# Patient Record
Sex: Female | Born: 1935 | Race: Black or African American | Hispanic: No | Smoking: Never smoker
Health system: Southern US, Community
[De-identification: ages and names within clinical notes are randomized; demographics above are authoritative.]

## PROBLEM LIST (undated history)

## (undated) DIAGNOSIS — E785 Hyperlipidemia, unspecified: Secondary | ICD-10-CM

## (undated) DIAGNOSIS — I639 Cerebral infarction, unspecified: Secondary | ICD-10-CM

## (undated) DIAGNOSIS — E079 Disorder of thyroid, unspecified: Secondary | ICD-10-CM

## (undated) DIAGNOSIS — I10 Essential (primary) hypertension: Secondary | ICD-10-CM

## (undated) HISTORY — PX: BLADDER SUSPENSION: SHX72

---

## 2004-06-16 ENCOUNTER — Inpatient Hospital Stay (HOSPITAL_COMMUNITY): Admission: RE | Admit: 2004-06-16 | Discharge: 2004-06-17 | Payer: Self-pay | Admitting: Urology

## 2004-07-07 ENCOUNTER — Ambulatory Visit (HOSPITAL_COMMUNITY): Admission: RE | Admit: 2004-07-07 | Discharge: 2004-07-07 | Payer: Self-pay | Admitting: Family Medicine

## 2004-07-07 ENCOUNTER — Inpatient Hospital Stay (HOSPITAL_COMMUNITY): Admission: EM | Admit: 2004-07-07 | Discharge: 2004-07-09 | Payer: Self-pay | Admitting: Emergency Medicine

## 2005-01-13 ENCOUNTER — Ambulatory Visit (HOSPITAL_COMMUNITY): Admission: RE | Admit: 2005-01-13 | Discharge: 2005-01-13 | Payer: Self-pay | Admitting: Family Medicine

## 2005-02-02 ENCOUNTER — Ambulatory Visit: Payer: Self-pay | Admitting: Internal Medicine

## 2005-03-10 ENCOUNTER — Ambulatory Visit: Payer: Self-pay | Admitting: Internal Medicine

## 2005-04-26 ENCOUNTER — Ambulatory Visit: Payer: Self-pay | Admitting: Internal Medicine

## 2005-06-27 ENCOUNTER — Ambulatory Visit: Payer: Self-pay | Admitting: Internal Medicine

## 2005-08-16 ENCOUNTER — Ambulatory Visit: Payer: Self-pay | Admitting: Internal Medicine

## 2005-08-16 LAB — PROTIME-INR: Protime: 22.1 Seconds — ABNORMAL HIGH (ref 10.6–13.4)

## 2005-09-06 LAB — PROTIME-INR
INR: 2.4 (ref 2.00–3.50)
Protime: 18.7 Seconds — ABNORMAL HIGH (ref 10.6–13.4)

## 2005-10-04 ENCOUNTER — Ambulatory Visit: Payer: Self-pay | Admitting: Internal Medicine

## 2005-10-04 LAB — PROTIME-INR: Protime: 25.3 Seconds (ref 10.6–13.4)

## 2005-10-18 LAB — PROTIME-INR
INR: 2.7 (ref 2.00–3.50)
Protime: 20 Seconds — ABNORMAL HIGH (ref 10.6–13.4)

## 2005-11-01 LAB — PROTIME-INR: INR: 3.2 (ref 2.00–3.50)

## 2005-12-05 ENCOUNTER — Ambulatory Visit: Payer: Self-pay | Admitting: Internal Medicine

## 2005-12-06 LAB — PROTIME-INR: Protime: 20.9 Seconds (ref 10.6–13.4)

## 2006-01-03 LAB — PROTIME-INR

## 2006-01-27 ENCOUNTER — Ambulatory Visit: Payer: Self-pay | Admitting: Internal Medicine

## 2006-02-28 LAB — PROTIME-INR: INR: 3.4 (ref 2.00–3.50)

## 2006-03-22 ENCOUNTER — Ambulatory Visit: Payer: Self-pay | Admitting: Internal Medicine

## 2006-03-24 LAB — PROTIME-INR
INR: 2.9 (ref 2.00–3.50)
Protime: 34.8 Seconds — ABNORMAL HIGH (ref 10.6–13.4)

## 2006-05-25 ENCOUNTER — Ambulatory Visit: Payer: Self-pay | Admitting: Internal Medicine

## 2006-05-31 LAB — PROTIME-INR
INR: 3.9 — ABNORMAL HIGH (ref 2.00–3.50)
Protime: 46.8 Seconds — ABNORMAL HIGH (ref 10.6–13.4)

## 2006-06-28 LAB — PROTIME-INR: INR: 2 (ref 2.00–3.50)

## 2006-07-24 ENCOUNTER — Ambulatory Visit: Payer: Self-pay | Admitting: Internal Medicine

## 2006-07-26 LAB — PROTIME-INR

## 2006-08-23 LAB — PROTIME-INR
INR: 3.1 (ref 2.00–3.50)
Protime: 37.2 Seconds — ABNORMAL HIGH (ref 10.6–13.4)

## 2006-09-11 ENCOUNTER — Ambulatory Visit: Payer: Self-pay | Admitting: Internal Medicine

## 2006-09-15 ENCOUNTER — Ambulatory Visit (HOSPITAL_COMMUNITY): Admission: RE | Admit: 2006-09-15 | Discharge: 2006-09-15 | Payer: Self-pay | Admitting: Family Medicine

## 2006-09-15 ENCOUNTER — Ambulatory Visit: Payer: Self-pay | Admitting: *Deleted

## 2006-09-15 ENCOUNTER — Encounter (INDEPENDENT_AMBULATORY_CARE_PROVIDER_SITE_OTHER): Payer: Self-pay | Admitting: *Deleted

## 2006-10-16 LAB — PROTIME-INR
INR: 1.8 — ABNORMAL LOW (ref 2.00–3.50)
Protime: 21.6 Seconds — ABNORMAL HIGH (ref 10.6–13.4)

## 2006-11-02 ENCOUNTER — Ambulatory Visit: Payer: Self-pay | Admitting: Internal Medicine

## 2006-11-08 LAB — PROTIME-INR: INR: 1.9 — ABNORMAL LOW (ref 2.00–3.50)

## 2006-11-22 LAB — PROTIME-INR: INR: 1.8 — ABNORMAL LOW (ref 2.00–3.50)

## 2006-12-18 ENCOUNTER — Ambulatory Visit: Payer: Self-pay | Admitting: Internal Medicine

## 2006-12-20 LAB — PROTIME-INR
INR: 1.9 — ABNORMAL LOW (ref 2.00–3.50)
Protime: 22.8 Seconds — ABNORMAL HIGH (ref 10.6–13.4)

## 2007-01-17 LAB — PROTIME-INR: Protime: 20.4 Seconds — ABNORMAL HIGH (ref 10.6–13.4)

## 2007-02-19 ENCOUNTER — Ambulatory Visit: Payer: Self-pay | Admitting: Internal Medicine

## 2007-02-21 LAB — PROTIME-INR
INR: 1.8 — ABNORMAL LOW (ref 2.00–3.50)
Protime: 21.6 Seconds — ABNORMAL HIGH (ref 10.6–13.4)

## 2007-03-27 LAB — PROTIME-INR
INR: 1.7 — ABNORMAL LOW (ref 2.00–3.50)
Protime: 20.4 Seconds — ABNORMAL HIGH (ref 10.6–13.4)

## 2007-03-27 LAB — CBC WITH DIFFERENTIAL/PLATELET
BASO%: 1.2 % (ref 0.0–2.0)
EOS%: 1.2 % (ref 0.0–7.0)
Eosinophils Absolute: 0.1 10*3/uL (ref 0.0–0.5)
MCH: 31.4 pg (ref 26.0–34.0)
MCHC: 34.5 g/dL (ref 32.0–36.0)
MCV: 91.1 fL (ref 81.0–101.0)
MONO%: 11.9 % (ref 0.0–13.0)
NEUT#: 2.4 10*3/uL (ref 1.5–6.5)
RBC: 3.85 10*6/uL (ref 3.70–5.32)
RDW: 12.3 % (ref 11.3–14.5)

## 2007-04-16 ENCOUNTER — Ambulatory Visit: Payer: Self-pay | Admitting: Internal Medicine

## 2007-04-18 LAB — PROTIME-INR: INR: 1.9 — ABNORMAL LOW (ref 2.00–3.50)

## 2007-04-18 LAB — CBC WITH DIFFERENTIAL/PLATELET
EOS%: 1.1 % (ref 0.0–7.0)
LYMPH%: 30.2 % (ref 14.0–48.0)
MCH: 30.8 pg (ref 26.0–34.0)
MCHC: 34.7 g/dL (ref 32.0–36.0)
MCV: 88.7 fL (ref 81.0–101.0)
MONO%: 13.5 % — ABNORMAL HIGH (ref 0.0–13.0)
RBC: 3.52 10*6/uL — ABNORMAL LOW (ref 3.70–5.32)
RDW: 11.6 % (ref 11.3–14.5)

## 2007-05-01 LAB — PROTIME-INR: INR: 1.9 — ABNORMAL LOW (ref 2.00–3.50)

## 2007-05-24 LAB — CBC WITH DIFFERENTIAL/PLATELET
BASO%: 1.3 % (ref 0.0–2.0)
EOS%: 1.8 % (ref 0.0–7.0)
MCH: 29.9 pg (ref 26.0–34.0)
MCHC: 32.8 g/dL (ref 32.0–36.0)
RBC: 3.78 10*6/uL (ref 3.70–5.32)
RDW: 11.8 % (ref 11.3–14.5)
lymph#: 1.9 10*3/uL (ref 0.9–3.3)

## 2007-05-24 LAB — PROTIME-INR: INR: 2.1 (ref 2.00–3.50)

## 2007-06-19 ENCOUNTER — Ambulatory Visit: Payer: Self-pay | Admitting: Internal Medicine

## 2007-06-21 LAB — PROTIME-INR: Protime: 26.4 Seconds — ABNORMAL HIGH (ref 10.6–13.4)

## 2007-07-26 LAB — CBC WITH DIFFERENTIAL/PLATELET
BASO%: 0.9 % (ref 0.0–2.0)
EOS%: 1.4 % (ref 0.0–7.0)
HCT: 36 % (ref 34.8–46.6)
MCH: 29.5 pg (ref 26.0–34.0)
MCHC: 32.5 g/dL (ref 32.0–36.0)
MONO#: 0.8 10*3/uL (ref 0.1–0.9)
RBC: 3.96 10*6/uL (ref 3.70–5.32)
RDW: 14 % (ref 11.3–14.5)
WBC: 5.4 10*3/uL (ref 3.9–10.0)
lymph#: 1.9 10*3/uL (ref 0.9–3.3)

## 2007-07-26 LAB — PROTIME-INR
INR: 2.7 (ref 2.00–3.50)
Protime: 32.4 Seconds — ABNORMAL HIGH (ref 10.6–13.4)

## 2007-08-21 ENCOUNTER — Ambulatory Visit: Payer: Self-pay | Admitting: Internal Medicine

## 2007-08-26 ENCOUNTER — Emergency Department (HOSPITAL_COMMUNITY): Admission: EM | Admit: 2007-08-26 | Discharge: 2007-08-26 | Payer: Self-pay | Admitting: Emergency Medicine

## 2007-08-29 LAB — PROTIME-INR: Protime: 22.8 Seconds — ABNORMAL HIGH (ref 10.6–13.4)

## 2007-11-16 ENCOUNTER — Encounter: Admission: RE | Admit: 2007-11-16 | Discharge: 2007-11-16 | Payer: Self-pay | Admitting: Family Medicine

## 2007-11-20 ENCOUNTER — Ambulatory Visit: Payer: Self-pay | Admitting: Internal Medicine

## 2007-11-22 LAB — CBC WITH DIFFERENTIAL/PLATELET
BASO%: 0.6 % (ref 0.0–2.0)
EOS%: 1.7 % (ref 0.0–7.0)
MCH: 29 pg (ref 26.0–34.0)
MCHC: 32.9 g/dL (ref 32.0–36.0)
RDW: 13.6 % (ref 11.3–14.5)
lymph#: 2.4 10*3/uL (ref 0.9–3.3)

## 2007-11-22 LAB — PROTIME-INR: INR: 2.7 (ref 2.00–3.50)

## 2007-12-20 LAB — PROTIME-INR
INR: 2.5 (ref 2.00–3.50)
Protime: 30 Seconds — ABNORMAL HIGH (ref 10.6–13.4)

## 2008-01-11 ENCOUNTER — Ambulatory Visit: Payer: Self-pay | Admitting: Internal Medicine

## 2008-01-23 LAB — PROTIME-INR: INR: 1.8 — ABNORMAL LOW (ref 2.00–3.50)

## 2008-02-21 LAB — CBC WITH DIFFERENTIAL/PLATELET
Basophils Absolute: 0.1 10*3/uL (ref 0.0–0.1)
EOS%: 2.5 % (ref 0.0–7.0)
HCT: 35.2 % (ref 34.8–46.6)
HGB: 11.4 g/dL — ABNORMAL LOW (ref 11.6–15.9)
LYMPH%: 38.9 % (ref 14.0–48.0)
MCH: 28.3 pg (ref 26.0–34.0)
MCV: 87.6 fL (ref 81.0–101.0)
NEUT%: 46.2 % (ref 39.6–76.8)
Platelets: 182 10*3/uL (ref 145–400)
lymph#: 2.1 10*3/uL (ref 0.9–3.3)

## 2008-02-21 LAB — PROTIME-INR

## 2008-03-18 ENCOUNTER — Ambulatory Visit: Payer: Self-pay | Admitting: Internal Medicine

## 2008-03-20 LAB — PROTIME-INR
INR: 2.8 (ref 2.00–3.50)
Protime: 33.6 Seconds — ABNORMAL HIGH (ref 10.6–13.4)

## 2008-04-17 LAB — PROTIME-INR
INR: 3.4 (ref 2.00–3.50)
Protime: 40.8 Seconds — ABNORMAL HIGH (ref 10.6–13.4)

## 2008-05-13 ENCOUNTER — Ambulatory Visit: Payer: Self-pay | Admitting: Internal Medicine

## 2008-05-22 LAB — CBC WITH DIFFERENTIAL/PLATELET
BASO%: 0.7 % (ref 0.0–2.0)
EOS%: 3 % (ref 0.0–7.0)
LYMPH%: 34.2 % (ref 14.0–48.0)
MCH: 28.6 pg (ref 26.0–34.0)
MCHC: 32.9 g/dL (ref 32.0–36.0)
MONO#: 0.6 10*3/uL (ref 0.1–0.9)
Platelets: 148 10*3/uL (ref 145–400)
RBC: 3.62 10*6/uL — ABNORMAL LOW (ref 3.70–5.32)
WBC: 5.1 10*3/uL (ref 3.9–10.0)
lymph#: 1.7 10*3/uL (ref 0.9–3.3)

## 2008-05-22 LAB — PROTIME-INR: Protime: 42 Seconds — ABNORMAL HIGH (ref 10.6–13.4)

## 2008-07-15 ENCOUNTER — Ambulatory Visit: Payer: Self-pay | Admitting: Internal Medicine

## 2008-07-17 LAB — PROTIME-INR: Protime: 27.6 Seconds — ABNORMAL HIGH (ref 10.6–13.4)

## 2008-08-20 LAB — CBC WITH DIFFERENTIAL/PLATELET
Basophils Absolute: 0 10*3/uL (ref 0.0–0.1)
HCT: 33.1 % — ABNORMAL LOW (ref 34.8–46.6)
HGB: 11 g/dL — ABNORMAL LOW (ref 11.6–15.9)
MONO#: 0.8 10*3/uL (ref 0.1–0.9)
NEUT#: 2 10*3/uL (ref 1.5–6.5)
NEUT%: 37.5 % — ABNORMAL LOW (ref 38.4–76.8)
RDW: 15 % — ABNORMAL HIGH (ref 11.2–14.5)
WBC: 5.3 10*3/uL (ref 3.9–10.3)
lymph#: 2.4 10*3/uL (ref 0.9–3.3)

## 2008-08-20 LAB — PROTIME-INR: INR: 2.3 (ref 2.00–3.50)

## 2008-09-17 ENCOUNTER — Ambulatory Visit: Payer: Self-pay | Admitting: Internal Medicine

## 2008-09-19 LAB — PROTIME-INR

## 2008-10-08 ENCOUNTER — Encounter: Admission: RE | Admit: 2008-10-08 | Discharge: 2008-11-20 | Payer: Self-pay | Admitting: Family Medicine

## 2008-10-20 LAB — PROTIME-INR

## 2008-11-17 ENCOUNTER — Ambulatory Visit: Payer: Self-pay | Admitting: Internal Medicine

## 2008-11-19 LAB — CBC WITH DIFFERENTIAL/PLATELET
BASO%: 0.2 % (ref 0.0–2.0)
EOS%: 2 % (ref 0.0–7.0)
HCT: 32.2 % — ABNORMAL LOW (ref 34.8–46.6)
LYMPH%: 38.9 % (ref 14.0–49.7)
MCH: 28.3 pg (ref 25.1–34.0)
MCHC: 32.9 g/dL (ref 31.5–36.0)
NEUT%: 45.6 % (ref 38.4–76.8)
Platelets: 153 10*3/uL (ref 145–400)

## 2008-12-17 ENCOUNTER — Ambulatory Visit: Payer: Self-pay | Admitting: Internal Medicine

## 2009-01-14 LAB — PROTIME-INR: INR: 2.1 (ref 2.00–3.50)

## 2009-02-09 ENCOUNTER — Ambulatory Visit: Payer: Self-pay | Admitting: Internal Medicine

## 2009-02-11 LAB — CBC WITH DIFFERENTIAL/PLATELET
Basophils Absolute: 0 10*3/uL (ref 0.0–0.1)
Eosinophils Absolute: 0.1 10*3/uL (ref 0.0–0.5)
HCT: 32.5 % — ABNORMAL LOW (ref 34.8–46.6)
HGB: 10.7 g/dL — ABNORMAL LOW (ref 11.6–15.9)
LYMPH%: 38.7 % (ref 14.0–49.7)
MCV: 87.4 fL (ref 79.5–101.0)
MONO%: 14.9 % — ABNORMAL HIGH (ref 0.0–14.0)
NEUT#: 2.2 10*3/uL (ref 1.5–6.5)
Platelets: 157 10*3/uL (ref 145–400)

## 2009-03-11 ENCOUNTER — Ambulatory Visit: Payer: Self-pay | Admitting: Internal Medicine

## 2009-04-08 LAB — PROTIME-INR
INR: 2.3 (ref 2.00–3.50)
Protime: 27.6 Seconds — ABNORMAL HIGH (ref 10.6–13.4)

## 2009-05-04 ENCOUNTER — Ambulatory Visit: Payer: Self-pay | Admitting: Internal Medicine

## 2009-05-06 LAB — CBC WITH DIFFERENTIAL/PLATELET
EOS%: 2.4 % (ref 0.0–7.0)
Eosinophils Absolute: 0.1 10*3/uL (ref 0.0–0.5)
MCH: 27.8 pg (ref 25.1–34.0)
MCV: 87 fL (ref 79.5–101.0)
MONO%: 13.3 % (ref 0.0–14.0)
NEUT#: 2 10*3/uL (ref 1.5–6.5)
RBC: 3.7 10*6/uL (ref 3.70–5.45)
RDW: 15.3 % — ABNORMAL HIGH (ref 11.2–14.5)
nRBC: 0 % (ref 0–0)

## 2009-05-06 LAB — PROTIME-INR: Protime: 32.4 Seconds — ABNORMAL HIGH (ref 10.6–13.4)

## 2009-06-03 ENCOUNTER — Ambulatory Visit: Payer: Self-pay | Admitting: Internal Medicine

## 2009-06-03 LAB — CBC WITH DIFFERENTIAL/PLATELET
BASO%: 0.2 % (ref 0.0–2.0)
Eosinophils Absolute: 0.1 10*3/uL (ref 0.0–0.5)
HCT: 33.7 % — ABNORMAL LOW (ref 34.8–46.6)
LYMPH%: 45.2 % (ref 14.0–49.7)
MCHC: 32.3 g/dL (ref 31.5–36.0)
MONO#: 0.7 10*3/uL (ref 0.1–0.9)
NEUT%: 39.8 % (ref 38.4–76.8)
Platelets: 157 10*3/uL (ref 145–400)
WBC: 5.3 10*3/uL (ref 3.9–10.3)

## 2009-07-01 LAB — CBC WITH DIFFERENTIAL/PLATELET
BASO%: 0.2 % (ref 0.0–2.0)
Basophils Absolute: 0 10*3/uL (ref 0.0–0.1)
EOS%: 1.9 % (ref 0.0–7.0)
HCT: 33.8 % — ABNORMAL LOW (ref 34.8–46.6)
HGB: 10.9 g/dL — ABNORMAL LOW (ref 11.6–15.9)
MCH: 28.1 pg (ref 25.1–34.0)
MONO#: 0.8 10*3/uL (ref 0.1–0.9)
NEUT%: 42.4 % (ref 38.4–76.8)
RDW: 15.3 % — ABNORMAL HIGH (ref 11.2–14.5)
WBC: 5.3 10*3/uL (ref 3.9–10.3)
lymph#: 2.2 10*3/uL (ref 0.9–3.3)

## 2009-07-01 LAB — PROTIME-INR
INR: 2.5 (ref 2.00–3.50)
Protime: 30 Seconds — ABNORMAL HIGH (ref 10.6–13.4)

## 2009-07-30 ENCOUNTER — Ambulatory Visit: Payer: Self-pay | Admitting: Internal Medicine

## 2009-08-03 LAB — PROTIME-INR
INR: 2.1 (ref 2.00–3.50)
Protime: 25.2 Seconds — ABNORMAL HIGH (ref 10.6–13.4)

## 2009-08-03 LAB — CBC WITH DIFFERENTIAL/PLATELET
BASO%: 0.2 % (ref 0.0–2.0)
EOS%: 2.1 % (ref 0.0–7.0)
HGB: 10.3 g/dL — ABNORMAL LOW (ref 11.6–15.9)
MCH: 28 pg (ref 25.1–34.0)
MCHC: 32 g/dL (ref 31.5–36.0)
MCV: 87.5 fL (ref 79.5–101.0)
MONO%: 12.6 % (ref 0.0–14.0)
RBC: 3.68 10*6/uL — ABNORMAL LOW (ref 3.70–5.45)
RDW: 14.8 % — ABNORMAL HIGH (ref 11.2–14.5)
lymph#: 1.8 10*3/uL (ref 0.9–3.3)

## 2009-08-04 LAB — KAPPA/LAMBDA LIGHT CHAINS
Kappa:Lambda Ratio: 0.4 (ref 0.26–1.65)
Lambda Free Lght Chn: 5.23 mg/dL — ABNORMAL HIGH (ref 0.57–2.63)

## 2009-09-14 ENCOUNTER — Ambulatory Visit: Payer: Self-pay | Admitting: Internal Medicine

## 2009-09-22 LAB — CBC WITH DIFFERENTIAL/PLATELET
BASO%: 0.2 % (ref 0.0–2.0)
HCT: 30.8 % — ABNORMAL LOW (ref 34.8–46.6)
HGB: 10.2 g/dL — ABNORMAL LOW (ref 11.6–15.9)
MCHC: 33.1 g/dL (ref 31.5–36.0)
MONO#: 0.7 10*3/uL (ref 0.1–0.9)
NEUT%: 34.9 % — ABNORMAL LOW (ref 38.4–76.8)
RDW: 14.8 % — ABNORMAL HIGH (ref 11.2–14.5)
WBC: 4.3 10*3/uL (ref 3.9–10.3)
lymph#: 2 10*3/uL (ref 0.9–3.3)

## 2009-09-22 LAB — PROTIME-INR
INR: 2.3 (ref 2.00–3.50)
Protime: 27.6 Seconds — ABNORMAL HIGH (ref 10.6–13.4)

## 2009-10-20 ENCOUNTER — Ambulatory Visit: Payer: Self-pay | Admitting: Internal Medicine

## 2009-10-22 LAB — CBC WITH DIFFERENTIAL/PLATELET
Basophils Absolute: 0 10*3/uL (ref 0.0–0.1)
EOS%: 2.1 % (ref 0.0–7.0)
HCT: 31 % — ABNORMAL LOW (ref 34.8–46.6)
HGB: 10.3 g/dL — ABNORMAL LOW (ref 11.6–15.9)
MCH: 29.4 pg (ref 25.1–34.0)
MCV: 88.2 fL (ref 79.5–101.0)
MONO%: 15.1 % — ABNORMAL HIGH (ref 0.0–14.0)
NEUT%: 45.7 % (ref 38.4–76.8)
Platelets: 165 10*3/uL (ref 145–400)

## 2009-10-22 LAB — COMPREHENSIVE METABOLIC PANEL
ALT: 18 U/L (ref 0–35)
Albumin: 3.9 g/dL (ref 3.5–5.2)
BUN: 26 mg/dL — ABNORMAL HIGH (ref 6–23)
CO2: 25 mEq/L (ref 19–32)
Calcium: 8.4 mg/dL (ref 8.4–10.5)
Chloride: 104 mEq/L (ref 96–112)
Creatinine, Ser: 1.18 mg/dL (ref 0.40–1.20)

## 2009-11-19 ENCOUNTER — Ambulatory Visit: Payer: Self-pay | Admitting: Internal Medicine

## 2009-12-17 LAB — PROTIME-INR
INR: 1.7 — ABNORMAL LOW (ref 2.00–3.50)
Protime: 20.4 Seconds — ABNORMAL HIGH (ref 10.6–13.4)

## 2010-01-18 ENCOUNTER — Ambulatory Visit: Payer: Self-pay | Admitting: Internal Medicine

## 2010-02-18 ENCOUNTER — Ambulatory Visit: Payer: Self-pay | Admitting: Internal Medicine

## 2010-02-19 LAB — PROTIME-INR
INR: 2.3 (ref 2.00–3.50)
Protime: 27.6 Seconds — ABNORMAL HIGH (ref 10.6–13.4)

## 2010-03-22 ENCOUNTER — Ambulatory Visit: Payer: Self-pay | Admitting: Internal Medicine

## 2010-03-22 LAB — PROTIME-INR
INR: 2.8 (ref 2.00–3.50)
Protime: 33.6 Seconds — ABNORMAL HIGH (ref 10.6–13.4)

## 2010-04-30 ENCOUNTER — Ambulatory Visit: Payer: Self-pay | Admitting: Internal Medicine

## 2010-05-05 LAB — CBC WITH DIFFERENTIAL/PLATELET
BASO%: 0.2 % (ref 0.0–2.0)
Basophils Absolute: 0 10*3/uL (ref 0.0–0.1)
Eosinophils Absolute: 0.1 10*3/uL (ref 0.0–0.5)
HGB: 11.2 g/dL — ABNORMAL LOW (ref 11.6–15.9)
LYMPH%: 39.5 % (ref 14.0–49.7)
MCH: 29.2 pg (ref 25.1–34.0)
MCV: 90.3 fL (ref 79.5–101.0)
MONO#: 0.5 10*3/uL (ref 0.1–0.9)
NEUT#: 1.7 10*3/uL (ref 1.5–6.5)
NEUT%: 45.2 % (ref 38.4–76.8)
RBC: 3.84 10*6/uL (ref 3.70–5.45)

## 2010-05-06 LAB — COMPREHENSIVE METABOLIC PANEL
Albumin: 4.3 g/dL (ref 3.5–5.2)
CO2: 31 mEq/L (ref 19–32)
Calcium: 9.3 mg/dL (ref 8.4–10.5)
Chloride: 101 mEq/L (ref 96–112)
Glucose, Bld: 73 mg/dL (ref 70–99)
Potassium: 3.7 mEq/L (ref 3.5–5.3)
Sodium: 140 mEq/L (ref 135–145)
Total Protein: 7.3 g/dL (ref 6.0–8.3)

## 2010-05-06 LAB — IGG, IGA, IGM: IgG (Immunoglobin G), Serum: 1830 mg/dL — ABNORMAL HIGH (ref 694–1618)

## 2010-05-06 LAB — LACTATE DEHYDROGENASE: LDH: 230 U/L (ref 94–250)

## 2010-05-30 ENCOUNTER — Encounter: Payer: Self-pay | Admitting: Internal Medicine

## 2010-06-02 ENCOUNTER — Ambulatory Visit: Payer: Self-pay | Admitting: Internal Medicine

## 2010-07-06 ENCOUNTER — Other Ambulatory Visit: Payer: Self-pay | Admitting: Internal Medicine

## 2010-07-06 ENCOUNTER — Encounter (HOSPITAL_BASED_OUTPATIENT_CLINIC_OR_DEPARTMENT_OTHER): Payer: Medicare Other | Admitting: Internal Medicine

## 2010-07-06 DIAGNOSIS — D472 Monoclonal gammopathy: Secondary | ICD-10-CM

## 2010-07-06 DIAGNOSIS — Z7901 Long term (current) use of anticoagulants: Secondary | ICD-10-CM

## 2010-07-06 DIAGNOSIS — Z86718 Personal history of other venous thrombosis and embolism: Secondary | ICD-10-CM

## 2010-07-06 LAB — PROTIME-INR
INR: 2.1 (ref 2.00–3.50)
Protime: 25.2 Seconds — ABNORMAL HIGH (ref 10.6–13.4)

## 2010-08-23 ENCOUNTER — Other Ambulatory Visit: Payer: Self-pay | Admitting: Internal Medicine

## 2010-08-23 ENCOUNTER — Encounter (HOSPITAL_BASED_OUTPATIENT_CLINIC_OR_DEPARTMENT_OTHER): Payer: Medicare Other | Admitting: Internal Medicine

## 2010-08-23 DIAGNOSIS — D472 Monoclonal gammopathy: Secondary | ICD-10-CM

## 2010-08-23 DIAGNOSIS — Z86718 Personal history of other venous thrombosis and embolism: Secondary | ICD-10-CM

## 2010-08-23 DIAGNOSIS — Z7901 Long term (current) use of anticoagulants: Secondary | ICD-10-CM

## 2010-08-23 LAB — CBC WITH DIFFERENTIAL/PLATELET
Basophils Absolute: 0 10*3/uL (ref 0.0–0.1)
EOS%: 1.6 % (ref 0.0–7.0)
Eosinophils Absolute: 0.1 10*3/uL (ref 0.0–0.5)
HCT: 32 % — ABNORMAL LOW (ref 34.8–46.6)
HGB: 10.6 g/dL — ABNORMAL LOW (ref 11.6–15.9)
MCH: 29.5 pg (ref 25.1–34.0)
MCV: 89.5 fL (ref 79.5–101.0)
NEUT#: 1.9 10*3/uL (ref 1.5–6.5)
NEUT%: 49.5 % (ref 38.4–76.8)
lymph#: 1.4 10*3/uL (ref 0.9–3.3)

## 2010-08-23 LAB — PROTIME-INR

## 2010-08-24 LAB — LACTATE DEHYDROGENASE: LDH: 252 U/L — ABNORMAL HIGH (ref 94–250)

## 2010-08-24 LAB — COMPREHENSIVE METABOLIC PANEL
AST: 25 U/L (ref 0–37)
Albumin: 4.2 g/dL (ref 3.5–5.2)
BUN: 22 mg/dL (ref 6–23)
Calcium: 9.3 mg/dL (ref 8.4–10.5)
Chloride: 103 mEq/L (ref 96–112)
Creatinine, Ser: 1.12 mg/dL (ref 0.40–1.20)
Glucose, Bld: 55 mg/dL — ABNORMAL LOW (ref 70–99)

## 2010-08-24 LAB — KAPPA/LAMBDA LIGHT CHAINS
Kappa:Lambda Ratio: 0.45 (ref 0.26–1.65)
Lambda Free Lght Chn: 5.61 mg/dL — ABNORMAL HIGH (ref 0.57–2.63)

## 2010-08-24 LAB — IGG, IGA, IGM
IgA: 303 mg/dL (ref 68–378)
IgG (Immunoglobin G), Serum: 1720 mg/dL — ABNORMAL HIGH (ref 694–1618)
IgM, Serum: 70 mg/dL (ref 60–263)

## 2010-09-22 ENCOUNTER — Other Ambulatory Visit: Payer: Self-pay | Admitting: Internal Medicine

## 2010-09-22 ENCOUNTER — Encounter: Payer: Medicare Other | Admitting: Internal Medicine

## 2010-09-22 LAB — PROTIME-INR
INR: 1.9 — ABNORMAL LOW (ref 2.00–3.50)
Protime: 22.8 Seconds — ABNORMAL HIGH (ref 10.6–13.4)

## 2010-09-24 NOTE — H&P (Signed)
Alexandra Lucas, Alexandra Lucas             ACCOUNT NO.:  [id number]   MEDICAL RECORD NO.:  [id number]          PATIENT TYPE:  INP   LOCATION:  4741                         FACILITY:  [facility]   PHYSICIAN:  Gertha Calkin, M.D.DATE OF BIRTH:  1917-06-08   DATE OF ADMISSION:  07/07/2004  DATE OF DISCHARGE:                                HISTORY & PHYSICAL   PRIMARY CARE PHYSICIAN:  Renaye Rakers, M.D.   HISTORY OF PRESENT ILLNESS:  This is a pleasant 77 year old African American  female with a past medical history of hypothyroidism, hypertension, urinary  frequency, who presents with left lower extremity swelling and pain x 3  weeks. The patient has recently been discharged from GU procedure that she  obtained in early February (June 17, 2004 discharge date). The patient  and daughter, at this time, state that she noticed swelling since the day of  discharge, however, was unconcerned at the time since both legs seemed  equally swollen. Over the course of the last two weeks, the swelling in the  right leg has gone down. However, the swelling in the left leg has not and  has actually increased and become tight. Over the last few days, there has  been some pain associated with that and increased tightness. She denies any  chest pain, shortness of breath, dyspnea on exertion. She has no cough,  fevers, chills. No abdominal pain, chest pain, or chest tightness. No  hemoptysis, hematemesis, and denies any other ongoing active issues right  now.   PAST MEDICAL HISTORY:  1.  Hypothyroidism.  2.  Hypertension.  3.  Urinary incontinence, bladder outlet insufficiency.  4.  She also has tension headaches, some difficulty sleeping at night, and      dyslipidemia.   PAST SURGICAL HISTORY:  Status post thyroidectomy.   MEDICATIONS:  1.  Lopressor 50 mg p.o. q.d.  2.  Cozaar 50 mg p.o. q.d.  3.  Klonopin 1 mg in the morning and 2 mg at night.  4.  Ambien CR 12.5 mg p.o. q.h.s. p.r.n.  5.  Fioricet  p.r.n. for tension headaches.  6.  Synthroid 100 mcg p.o. b.i.d. (questionable).  7.  Macrobid 50 mg p.o. q d  8.  Lescol 80 mg p.o. q.h.s.  9.  Colace q.d. at night.   ALLERGIES:  None.   SOCIAL HISTORY:  She has recently moved from [place]. [place]. Here visiting and  possibly going to be a permanent resident with her daughter here in  [place]. She denies ever using alcohol, tobacco, or illicit drugs.   FAMILY HISTORY:  Noncontributory and states there are no medical illnesses  other than there is a history of chronic anemia in the family, most likely a  genetic disorder. No cancer, no coronary artery disease, no strokes.   REVIEW OF SYMPTOMS:  Essentially negative except for HPI. She does have some  urinary frequency and has improved somewhat after the surgery but still  requires Ditropan.   PHYSICAL EXAMINATION:  VITAL SIGNS:  Temperature is 97.0, blood pressure  137/65, heart rate of 120, respirations 20. Saturation 99% on room air.  GENERAL:  This is a pleasant African American female lying in bed with no  acute distress.  HEENT:  Pupils are equal, round, and reactive to light. Extraocular  movements intact. Oropharynx is clear without exudate.  NECK:  Supple without masses or bruits. No lymphadenopathy.  CHEST:  Clear to auscultation bilaterally with good air movement.  CARDIOVASCULAR:  Regular rate and rhythm. No murmurs, gallops, or rubs.  PULSES:  Intact and symmetric in the upper and lower extremities. No JVP.  ABDOMEN:  Soft, nontender, and nondistended. Bowel sounds are present. No  flank tenderness.  EXTREMITIES:  There is no clubbing or cyanosis. There is significant and  tight nonpitting edema on the left lower extremity that extends up to the  middle of her thigh starting from her feet. There is a negative Homans' sign  and no palpable cords; however, she does complain of tenderness with  palpation.  NEUROLOGICAL:  Cranial nerves II through XII intact. No sensation  deficits.  No focal strength deficits. She is globally weak to probably 4 to 4-/5  globally.   LABORATORY DATA:  She had a Doppler study done which showed an extensive DVT  in the left lower extremity extending from the popliteal vein all the way up  into the common femoral vein. Her superficial veins are patent.   Her CBC showed a white count of 6.4, hemoglobin 10.4, hematocrit 30.5, MCV  of 87, platelets of 252,000. Her I-stat sodium is 134, potassium 4.2,  chloride of 106, BUN 17, glucose of 102. A pCO2 of 35 and a creatinine of  1.0.   ASSESSMENT/PLAN:  1.  Deep vein thrombosis.  2.  Hypertension.  3.  Hypothyroidism.  4.  Urinary incontinence.  5.  Dyslipidemia.  6.  Gastrointestinal prophylaxis.   PLAN:  The plan is to admit to a telemetry bed. I am going to obtain a CT of  her chest with contrast to insure that she does not have any clot burden in  her lungs; however, this will not change our management at this time. We  will start her on full dose heparin as she was negative on hemoccult on  rectal exam. We will also check a TSH and a fasting lipid profile. We will  initiate Coumadin for therapy. We will need to bridge this for a goal of two  to three. At this time, I am not working her up for any hypercoagulable  state as she seems to have a temporal dissociation with an identified risk,  that being her surgery in February. If this does continue on, would probably  need further workup for hypercoagulable state.   Note, daughter states that the family history of chronic anemia has been  worked up on the clinic basis with genetic testing and electrophoresis and  nothing common has been found. However, there has been no associated  symptoms and no GI issues in the family with this anemia.      JD/MEDQ  D:  07/07/2004  T:  07/07/2004  Job:  [id number]   cc:   Renaye Rakers, M.D.  [phone], [address]  [place]  [place] [address] Fax: [phone]

## 2010-09-24 NOTE — Discharge Summary (Signed)
Alexandra Lucas, Alexandra Lucas             ACCOUNT NO.:  [id number]   MEDICAL RECORD NO.:  [id number]          PATIENT TYPE:  INP   LOCATION:  4741                         FACILITY:  [facility]   PHYSICIAN:  Michaelyn Barter, M.D. DATE OF BIRTH:  April 29, 1918   DATE OF ADMISSION:  07/07/2004  DATE OF DISCHARGE:  07/09/2004                                 DISCHARGE SUMMARY   PRIMARY CARE PHYSICIAN:  Renaye Rakers, M.D.   FINAL DIAGNOSES:  1.  Left lower extremity deep vein thrombosis.  2.  Pulmonary embolus.  3.  Anemia.   HISTORY OF PRESENT ILLNESS:  Ms. Trolinger is an 77 year old female with past  medical history of hypothyroidism, hypertension, who stated that she had  experienced left lower extremity swelling and pain for approximately three  weeks.  She had recently been discharged secondary to being treated with a  GU procedure.  She had noticed some swelling since the day of her discharge  in her lower extremity.  She went on to stated that over the two weeks prior  to this admission, the swelling of the right leg had gone down.  However,  the swelling in the left leg had not, and appeared to increase, and her leg  became tight.  She went on to develop pain.  She denied having any chest  pain, shortness of breath.   PAST MEDICAL HISTORY:  1.  Hypothyroidism.  2.  Hypertension.  3.  Urinary incontinence, bladder outlet insufficiency.  4.  Tension headaches.  5.  Insomnia.  6.  Dyslipidemia.   PAST SURGICAL HISTORY:  Thyroidectomy.   ALLERGIES:  No known drug allergies.   SOCIAL HISTORY:  Alcohol:  Denies.  Cigarettes:  Denies.  Street Drugs:  Denies.   FAMILY HISTORY:  Chronic anemia in the family.   HOSPITAL COURSE:  #1 - LEFT LOWER EXTREMITY DEEP VEIN THROMBOSIS.  On March  1, the patient had a venous Duplex completed of her left lower extremity.  It was interpreted as DVT noted in the popliteal vein, gastrocnemius vein,  femoral vein and was beginning to enter the CPV.  Unable  to adequately  visualize calf veins, superficial veins were patent.  The patient was  subsequently started on heparin IV, and this was followed by Coumadin.   #2 - PULMONARY EMBOLUS.  The patient had a CT chest angiography with  contrast completed on July 07, 2004.  Final impression was that it was  positive for pulmonary embolus.  The largest clot was identified at the  bifurcation of the left main pulmonary artery and descending left  intralobular pulmonary artery.  There was a clot identified in the segmental  artery in the right lower lobe.  There was cardiomegaly.  No pleural or  pericardial effusion.  The lungs demonstrate dependent bibasilar atelectasis  and some scarring on the right, but no nodule, mass, or evidence of  pneumonia.   #3 - ANEMIA.  The patient's hemoglobin at the time of admission was noted to  be 10.4.  Subsequently, it went up to 11.9 and dropped to 9.3 and then down  to 9.0.  But, by the date of discharge, it was noted to be 9.9.  Over the  course of the patient's hospitalization, she denied having any chest pain,  and actually by day #2 of her admission, she stated that she did not feel  too bad.  She said her leg was not as bothersome as previously.  She stated  that her breathing was good and denied having any chest pain.  By March 3,  two days following her admission, she denied again having any chest pain or  shortness of breath, and she stated that she felt fine and wanted to go  home.  I called her primary care physician to discuss the possibility of  discharging the patient home, and I actually tried to talk the patient into  staying in the hospital longer, given the fact that on the date of her  discharge, March 3, her INR was only 1.1.  However, the patient stated that  she really wanted to go home, and did not want to stay in the hospital.  I  spoke to Dr. Renaye Rakers multiple times regarding this.  Dr Parke Simmers also spoke  to the patient, and neither  one of [place] could convince the patient to stay in  the hospital.  Therefore, decision had been made to switch the patient's  anticoagulation to Lovenox.  The patient's condition at the time of  discharge was stable.  On the date of discharge, her vitals were temperature  97.7, heart rate 73, respirations 16, blood pressure 135/70, saturation 97%  on 2 liters of oxygen.  Hemoglobin was 9.9, hematocrit 28.9, white count  4.2, platelets 253,000.  The decision as made to discharge the patient home.   DISCHARGE MEDICATIONS:  1.  Lovenox 80 mg subcu q.12h. for her DVT and PE.  2.  Fioricet 1-2 tablets q.8h. p.r.n.  3.  Coumadin 7.5 mg daily.   FOLLOWUP:  Dr. Renaye Rakers was called and follow up appointment was arranged  for Monday, July 12, 2004, at 1:45 p.m.  She was told that she could have  her INR checked at that particular time.      OR/MEDQ  D:  09/12/2004  T:  09/13/2004  Job:  [id number]   cc:   Renaye Rakers, M.D.  [address], [address]  [place]  [place] [address]  Fax: [phone]

## 2010-09-24 NOTE — Op Note (Signed)
Alexandra Lucas, Alexandra Lucas             ACCOUNT NO.:  [id number]   MEDICAL RECORD NO.:  [id number]          PATIENT TYPE:  AMB   LOCATION:  DAY                          [facility]ACILITY:  [facility]   PHYSICIAN:  Jamison Neighbor, M.D.  DATE OF BIRTH:  07/03/1917   DATE OF PROCEDURE:  06/15/2004  DATE OF DISCHARGE:                                 OPERATIVE REPORT   PREOPERATIVE DIAGNOSES:  1.  Cystocele.  2.  Stress urinary incontinence.  3.  Rectocele.   POSTOPERATIVE DIAGNOSES:  1.  Cystocele.  2.  Stress urinary incontinence.  3.  Rectocele.   PROCEDURE:  Anterior repair with fascial reinforcement for chronic repair of  cystocele, pubovaginal sling, posterior repair with fascial reinforcement  and enterocele repair.   SURGEON:  Jamison Neighbor, M.D.   ASSISTANT:  Thyra Breed, M.D.   ANESTHESIA:  General anesthesia.   COMPLICATIONS:  None.   DRAINS:  A 16 French Foley catheter.   BRIEF HISTORY:  This 77 year old female has significant bladder prolapse.  The patient has had problems with chronic infections, although she has  responded well to antibiotic prophylaxis.  The patient has a pessary in  place which has caused problems with irritation and breakdown.  The patient  did undergo urodynamics which indicates she had normal detrusor  contractions.  We noted there was prolapse out beyond the introitus.  The  patient was given several options for therapy.  Due to her advanced age, we  felt it would be best if we did a vaginal repair so one of our options,  namely the vaginal vault inspection was felt to be inappropriate.  Because  of the poor quality of her tissues, it was felt that fascial reinforcement  or mesh reinforcement would be necessary, but because of her age and  increased risk for erosion, it was decided to use fascia instead of mesh.  The patient is to undergo an anterior repair with reinforcement, a placement  of a sling and correction of any additional rectocele or  enterocele that are  detected.  The patient understands the risks and benefits of the procedure  and gave full informed consent.   DESCRIPTION OF PROCEDURE:  After successful induction of general anesthesia,  patient was placed in the dorsal lithotomy position.  The pessary was  removed and she was fully prepped and draped.  The labia were sutured out to  the medial thigh.  A Foley catheter was inserted.  A small degraded urethral  mucosal prolapse was noted but it was certainly not something that required  any therapy.  The anterior vaginal mucosa was infiltrated with local  anesthetic all the way back to the patient's uterus.  The uterus is small  and there is certainly no pressing reason for this to be removed at the  patient's advanced age and she truly did not wish to undergo hysterectomy.  The mucosa was then incised in the midline all the way back to the uterus  and the cardinal ligaments.  The mucosal flaps were raised bilaterally  thinning back to the endopelvic fascia.  At the proximal level  of the mid  urethra endopelvic fascia was pierced and mobilized.  The redundant bladder  was then approximated in the midline with vertical mattress sutures of 2-0  Vicryl.  It was felt that this would not be an adequate repair however, due  to the poor quality of tissue and the large size of the cystocele.  The  decision was made to place a fascial sling using Tutoplast.  A 4 x 7 piece  of Tutoplast was obtained.  This was shaped into a T piece where the wall  arm of the T would extend up into the retropubic space as a sling and the  short piece of the T would cover the repair and allow for additional support  at ingrowth.  The patient had a small incision made just above the pubic  bone on each side.  A tonsil clamp was passed from this abdominal incision  down to the vaginal incision.  The #1 nylon sutures were sewn onto the ends  of the sling and these were pulled up into the abdominal  wound.  Cystoscopy  was performed with 12 degree and 7 degree lenses which indicated that the  sling had not entered the bladder.  A second set of sutures was placed back  in the cardinal ligaments with helical __________ of the #1 nylon.  This was  then brought up to the abdominal incision.  Cystoscopy was performed to  insure that this did not catch the sling.  The sling itself was then packed  down so that it lay flat over the repair and went all the way back to the  cardinal ligaments.  The anterior vaginal mucosa was then trimmed and closed  with a running suture of 2-0 Vicryl.  The cystoscopic examination was  performed one final time.  This indicated there was no injury to the  bladder.  The bladder support appeared to be appropriate.  The sling tension  was then set so that two fingers could be placed between the sutures on the  abdominal incision.  The cystoscope had 30 to 45 degrees of play to place  the cystoscope.  There was no angulation of the scope and there was good  mucosal coaptation.  With a full bladder and a Crede' maneuver, there was  the prompt straight flow of irrigant.  The sutures were tied down across the  midline and then the front sutures were tied to the back sutures completing  repair.  That area was irrigated and at the end of the procedure, was closed  with a subcuticular stitch.  The patient clearly had additional bulges that  needed to be repaired and these were a rectocele with a possible small  enterocele.  The posterior vaginal mucosa was then infiltrated and incision  was made beginning at the perineal body extending back toward the top of the  vault.  The flaps and mucosa were raised all the way back to the end of the  vault.  The patient clearly had a rectocele.  This was reduced with Vicryl  sutures. At the top of that vault there was what appeared to be the beginnings of an enterocele.  Small stitch was placed in that area to close  that off.  The  patient had a piece of Tutoplast placed down on top of these  repair and this was then closed underneath the mucosa to improve the  support.  The vaginal mucosa was trimmed and closed with a series of  interrupted figure-of-eight sutures of 2-0 Vicryl.  There was stitch placed  in the perineal body for closure as well.  At the endo of the procedure, the  patient's labial sutures were taken down and inspection showed that the  bladder was very well supported with no further bulge.  The urethra was in a  normal position.  It did not appear angulated or excessively supported.  The  vagina had a normal banana-shaped appearance.  It had adequate length and  the posterior repair appeared to have successfully closed the rectocele  without excessive closure or tightening of the vaginal introitus.  The area  was irrigated and then a pack was placed that included vaginal gauze with  antibiotic ointment.  The patient's Foley catheter was left in place.  She  will be taken upstairs for 23-hour observation.  The patient's daughter does  know how to do self-catheterization so when the catheter is removed if she  is not urinating normally, she can go home on a self-cath program.      RJE/MEDQ  D:  06/15/2004  T:  06/15/2004  Job:  [id number]

## 2010-10-22 ENCOUNTER — Encounter (HOSPITAL_BASED_OUTPATIENT_CLINIC_OR_DEPARTMENT_OTHER): Payer: Medicare Other | Admitting: Internal Medicine

## 2010-10-22 ENCOUNTER — Other Ambulatory Visit: Payer: Self-pay | Admitting: Internal Medicine

## 2010-10-22 DIAGNOSIS — D472 Monoclonal gammopathy: Secondary | ICD-10-CM

## 2010-10-22 DIAGNOSIS — Z86718 Personal history of other venous thrombosis and embolism: Secondary | ICD-10-CM

## 2010-10-22 DIAGNOSIS — Z7901 Long term (current) use of anticoagulants: Secondary | ICD-10-CM

## 2010-10-22 LAB — PROTIME-INR: Protime: 22.8 Seconds — ABNORMAL HIGH (ref 10.6–13.4)

## 2010-11-30 ENCOUNTER — Other Ambulatory Visit: Payer: Self-pay | Admitting: Internal Medicine

## 2010-11-30 ENCOUNTER — Encounter (HOSPITAL_BASED_OUTPATIENT_CLINIC_OR_DEPARTMENT_OTHER): Payer: Medicare Other | Admitting: Internal Medicine

## 2010-11-30 DIAGNOSIS — Z7901 Long term (current) use of anticoagulants: Secondary | ICD-10-CM

## 2010-11-30 DIAGNOSIS — D472 Monoclonal gammopathy: Secondary | ICD-10-CM

## 2010-11-30 DIAGNOSIS — Z86718 Personal history of other venous thrombosis and embolism: Secondary | ICD-10-CM

## 2010-11-30 LAB — CBC WITH DIFFERENTIAL/PLATELET
BASO%: 0.2 % (ref 0.0–2.0)
EOS%: 1.6 % (ref 0.0–7.0)
HCT: 30.9 % — ABNORMAL LOW (ref 34.8–46.6)
LYMPH%: 43 % (ref 14.0–49.7)
MCH: 28.4 pg (ref 25.1–34.0)
MCHC: 32.4 g/dL (ref 31.5–36.0)
MCV: 87.8 fL (ref 79.5–101.0)
MONO#: 0.7 10*3/uL (ref 0.1–0.9)
MONO%: 16.4 % — ABNORMAL HIGH (ref 0.0–14.0)
NEUT%: 38.8 % (ref 38.4–76.8)
Platelets: 170 10*3/uL (ref 145–400)
RBC: 3.52 10*6/uL — ABNORMAL LOW (ref 3.70–5.45)

## 2010-11-30 LAB — PROTIME-INR

## 2011-01-25 ENCOUNTER — Encounter (HOSPITAL_BASED_OUTPATIENT_CLINIC_OR_DEPARTMENT_OTHER): Payer: Medicare Other | Admitting: Internal Medicine

## 2011-01-25 ENCOUNTER — Other Ambulatory Visit: Payer: Self-pay | Admitting: Internal Medicine

## 2011-01-25 DIAGNOSIS — Z7901 Long term (current) use of anticoagulants: Secondary | ICD-10-CM

## 2011-01-25 DIAGNOSIS — D472 Monoclonal gammopathy: Secondary | ICD-10-CM

## 2011-01-25 DIAGNOSIS — Z86718 Personal history of other venous thrombosis and embolism: Secondary | ICD-10-CM

## 2011-03-01 ENCOUNTER — Encounter (HOSPITAL_BASED_OUTPATIENT_CLINIC_OR_DEPARTMENT_OTHER): Payer: Medicare Other | Admitting: Internal Medicine

## 2011-03-01 ENCOUNTER — Other Ambulatory Visit: Payer: Self-pay | Admitting: Internal Medicine

## 2011-03-01 DIAGNOSIS — Z7901 Long term (current) use of anticoagulants: Secondary | ICD-10-CM

## 2011-03-01 DIAGNOSIS — D472 Monoclonal gammopathy: Secondary | ICD-10-CM

## 2011-03-01 DIAGNOSIS — Z86718 Personal history of other venous thrombosis and embolism: Secondary | ICD-10-CM

## 2011-03-01 LAB — CBC WITH DIFFERENTIAL/PLATELET
BASO%: 0.2 % (ref 0.0–2.0)
Basophils Absolute: 0 10*3/uL (ref 0.0–0.1)
EOS%: 0.9 % (ref 0.0–7.0)
HGB: 10.8 g/dL — ABNORMAL LOW (ref 11.6–15.9)
MCH: 28.6 pg (ref 25.1–34.0)
NEUT#: 2 10*3/uL (ref 1.5–6.5)
RBC: 3.78 10*6/uL (ref 3.70–5.45)
RDW: 15.2 % — ABNORMAL HIGH (ref 11.2–14.5)
lymph#: 1.7 10*3/uL (ref 0.9–3.3)
nRBC: 0 % (ref 0–0)

## 2011-04-05 ENCOUNTER — Other Ambulatory Visit: Payer: Medicare Other | Admitting: Lab

## 2011-04-25 ENCOUNTER — Other Ambulatory Visit: Payer: Self-pay | Admitting: *Deleted

## 2011-04-25 DIAGNOSIS — I82409 Acute embolism and thrombosis of unspecified deep veins of unspecified lower extremity: Secondary | ICD-10-CM

## 2011-04-26 ENCOUNTER — Other Ambulatory Visit: Payer: Self-pay | Admitting: *Deleted

## 2011-04-26 DIAGNOSIS — I82409 Acute embolism and thrombosis of unspecified deep veins of unspecified lower extremity: Secondary | ICD-10-CM

## 2011-04-26 MED ORDER — WARFARIN SODIUM 3 MG PO TABS: 3.0000 mg | ORAL_TABLET | ORAL | Status: DC

## 2011-04-29 ENCOUNTER — Telehealth: Payer: Self-pay | Admitting: Internal Medicine

## 2011-04-29 NOTE — Telephone Encounter (Signed)
Pt wants to know if Dr Donnald Garre thinks it is okay to have a knee replacement - I told her per Dr Donnald Garre he can manage her coagulation if her surgeon wants her to have procedure.She voices understanding.

## 2011-05-02 ENCOUNTER — Other Ambulatory Visit (HOSPITAL_BASED_OUTPATIENT_CLINIC_OR_DEPARTMENT_OTHER): Payer: Medicare Other | Admitting: Lab

## 2011-05-02 DIAGNOSIS — I82409 Acute embolism and thrombosis of unspecified deep veins of unspecified lower extremity: Secondary | ICD-10-CM

## 2011-05-02 LAB — PROTIME-INR
INR: 1.4 — ABNORMAL LOW (ref 2.00–3.50)
Protime: 16.8 Seconds — ABNORMAL HIGH (ref 10.6–13.4)

## 2011-05-30 ENCOUNTER — Ambulatory Visit: Payer: Medicare Other | Admitting: Internal Medicine

## 2011-05-30 ENCOUNTER — Other Ambulatory Visit: Payer: Medicare Other | Admitting: Lab

## 2011-06-07 ENCOUNTER — Telehealth: Payer: Self-pay | Admitting: Internal Medicine

## 2011-06-07 NOTE — Telephone Encounter (Signed)
daughter called to r/s her missed appt,l/m for 2/19 appt  aom

## 2011-06-27 ENCOUNTER — Other Ambulatory Visit: Payer: Self-pay | Admitting: *Deleted

## 2011-06-27 DIAGNOSIS — D472 Monoclonal gammopathy: Secondary | ICD-10-CM

## 2011-06-28 ENCOUNTER — Ambulatory Visit (HOSPITAL_BASED_OUTPATIENT_CLINIC_OR_DEPARTMENT_OTHER): Payer: Medicare Other | Admitting: Internal Medicine

## 2011-06-28 ENCOUNTER — Other Ambulatory Visit: Payer: Medicare Other | Admitting: Lab

## 2011-06-28 DIAGNOSIS — I2699 Other pulmonary embolism without acute cor pulmonale: Secondary | ICD-10-CM

## 2011-06-28 DIAGNOSIS — D472 Monoclonal gammopathy: Secondary | ICD-10-CM

## 2011-06-28 LAB — CBC WITH DIFFERENTIAL/PLATELET
BASO%: 0.3 % (ref 0.0–2.0)
EOS%: 0.9 % (ref 0.0–7.0)
LYMPH%: 40.9 % (ref 14.0–49.7)
MCH: 30.2 pg (ref 25.1–34.0)
MCHC: 32.8 g/dL (ref 31.5–36.0)
MCV: 92.2 fL (ref 79.5–101.0)
MONO%: 14.7 % — ABNORMAL HIGH (ref 0.0–14.0)
NEUT#: 2.1 10*3/uL (ref 1.5–6.5)
RBC: 3.31 10*6/uL — ABNORMAL LOW (ref 3.70–5.45)
RDW: 16.6 % — ABNORMAL HIGH (ref 11.2–14.5)

## 2011-06-28 LAB — PROTIME-INR
INR: 1.7 — ABNORMAL LOW (ref 2.00–3.50)
Protime: 20.4 Seconds — ABNORMAL HIGH (ref 10.6–13.4)

## 2011-06-28 NOTE — Progress Notes (Signed)
[facility] Telephone:([phone]   [phone]  OFFICE PROGRESS NOTE  PRINCIPAL DIAGNOSES:   1. History of deep vein thrombosis and pulmonary emboli. 2. Monoclonal gammopathy of undetermined significance (MGUS).  CURRENT THERAPY:   1. Coumadin 3 mg daily on Monday, Wednesday and Friday, and 1.5 mg on all other days now for more than 6 years. 2. Observation for the MGUS.  INTERVAL HISTORY: Alexandra Lucas 78 y.o. female returns to the clinic today for visit accompanied her daughter. The patient is feeling fine with no specific complaints. She has been on treatment with Coumadin for the last 6 years for history of deep venous thrombosis and pulmonary emboli that happen after her surgery. The patient is here today for evaluation and discussion of her options regarding the duration of treatment with Coumadin. She denied having any bleeding issues, no bruises or ecchymosis. No weight loss or night sweats. She is also on observation for MGUS.  ALLERGIES:  is allergic to aspirin.  MEDICATIONS:  Current Outpatient Prescriptions  Medication Sig Dispense Refill  . warfarin (COUMADIN) 3 MG tablet Take 1 tablet (3 mg total) by mouth as directed.  90 tablet  4    REVIEW OF SYSTEMS:  A comprehensive review of systems was negative.   PHYSICAL EXAMINATION: General appearance: alert, cooperative and no distress Neck: no adenopathy Lymph nodes: Cervical, supraclavicular, and axillary nodes normal. Resp: clear to auscultation bilaterally Cardio: regular rate and rhythm, S1, S2 normal, no murmur, click, rub or gallop GI: soft, non-tender; bowel sounds normal; no masses,  no organomegaly Extremities: extremities normal, atraumatic, no cyanosis or edema  ECOG PERFORMANCE STATUS: 1 - Symptomatic but completely ambulatory  Blood pressure 144/69, pulse 55, temperature 96.2 F (35.7 C), temperature source Oral.  LABORATORY DATA: Lab Results  Component Value Date   WBC  4.9 06/28/2011   HGB 10.0* 06/28/2011   HCT 30.5* 06/28/2011   MCV 92.2 06/28/2011   PLT 161 06/28/2011      Chemistry      Component Value Date/Time   NA 140 08/23/2010 1104   NA 140 08/23/2010 1104   NA 140 08/23/2010 1104   NA 140 08/23/2010 1104   K 3.8 08/23/2010 1104   K 3.8 08/23/2010 1104   K 3.8 08/23/2010 1104   K 3.8 08/23/2010 1104   CL 103 08/23/2010 1104   CL 103 08/23/2010 1104   CL 103 08/23/2010 1104   CL 103 08/23/2010 1104   CO2 26 08/23/2010 1104   CO2 26 08/23/2010 1104   CO2 26 08/23/2010 1104   CO2 26 08/23/2010 1104   BUN 22 08/23/2010 1104   BUN 22 08/23/2010 1104   BUN 22 08/23/2010 1104   BUN 22 08/23/2010 1104   CREATININE 1.12 08/23/2010 1104   CREATININE 1.12 08/23/2010 1104   CREATININE 1.12 08/23/2010 1104   CREATININE 1.12 08/23/2010 1104      Component Value Date/Time   CALCIUM 9.3 08/23/2010 1104   CALCIUM 9.3 08/23/2010 1104   CALCIUM 9.3 08/23/2010 1104   CALCIUM 9.3 08/23/2010 1104   ALKPHOS 50 08/23/2010 1104   ALKPHOS 50 08/23/2010 1104   ALKPHOS 50 08/23/2010 1104   ALKPHOS 50 08/23/2010 1104   AST 25 08/23/2010 1104   AST 25 08/23/2010 1104   AST 25 08/23/2010 1104   AST 25 08/23/2010 1104   ALT 19 08/23/2010 1104   ALT 19 08/23/2010 1104   ALT 19 08/23/2010 1104   ALT  19 08/23/2010 1104   BILITOT 0.7 08/23/2010 1104   BILITOT 0.7 08/23/2010 1104   BILITOT 0.7 08/23/2010 1104   BILITOT 0.7 08/23/2010 1104       RADIOGRAPHIC STUDIES: No results found.  ASSESSMENT: This is a very pleasant 78 years old African American female with history of deep venous thrombosis and pulmonary emboli as well as MGUS. The patient was treated with Coumadin for the last 6 years.  PLAN: I have a lengthy discussion with the patient and her daughter today about her current condition and treatment options. Because of her age and risk of falls, I recommended for the patient to discontinue the treatment with Coumadin at this point. She will continue on observation for the MGUS. I  recommended for the patient to continue routine followup visit with her primary care physician and I would see her on as needed basis. The patient and her daughter agreed to the current plan.  All questions were answered. The patient knows to call the clinic with any problems, questions or concerns. We can certainly see the patient much sooner if necessary.

## 2013-03-29 ENCOUNTER — Other Ambulatory Visit: Payer: Self-pay | Admitting: Family Medicine

## 2013-03-29 ENCOUNTER — Other Ambulatory Visit: Payer: Medicare Other

## 2013-03-29 DIAGNOSIS — R52 Pain, unspecified: Secondary | ICD-10-CM

## 2013-04-03 ENCOUNTER — Ambulatory Visit
Admission: RE | Admit: 2013-04-03 | Discharge: 2013-04-03 | Disposition: A | Discharge status: Home / Self Care | Payer: Medicare Other | Source: Ambulatory Visit | Attending: Family Medicine | Admitting: Family Medicine

## 2013-04-03 ENCOUNTER — Other Ambulatory Visit: Payer: Self-pay | Admitting: Family Medicine

## 2013-04-03 DIAGNOSIS — IMO0002 Reserved for concepts with insufficient information to code with codable children: Secondary | ICD-10-CM

## 2013-04-03 DIAGNOSIS — R52 Pain, unspecified: Secondary | ICD-10-CM

## 2013-06-15 ENCOUNTER — Encounter (HOSPITAL_COMMUNITY): Payer: Self-pay | Admitting: Emergency Medicine

## 2013-06-15 ENCOUNTER — Emergency Department (HOSPITAL_COMMUNITY): Payer: Medicare Other

## 2013-06-15 ENCOUNTER — Inpatient Hospital Stay (HOSPITAL_COMMUNITY)
Admission: EM | Admit: 2013-06-15 | Discharge: 2013-06-22 | DRG: 291 | Disposition: A | Discharge status: Hospice | Payer: Medicare Other | Attending: Internal Medicine | Admitting: Internal Medicine

## 2013-06-15 ENCOUNTER — Inpatient Hospital Stay (HOSPITAL_COMMUNITY): Payer: Medicare Other

## 2013-06-15 DIAGNOSIS — E079 Disorder of thyroid, unspecified: Secondary | ICD-10-CM | POA: Diagnosis present

## 2013-06-15 DIAGNOSIS — R4182 Altered mental status, unspecified: Secondary | ICD-10-CM

## 2013-06-15 DIAGNOSIS — D649 Anemia, unspecified: Secondary | ICD-10-CM

## 2013-06-15 DIAGNOSIS — J111 Influenza due to unidentified influenza virus with other respiratory manifestations: Secondary | ICD-10-CM

## 2013-06-15 DIAGNOSIS — R69 Illness, unspecified: Secondary | ICD-10-CM

## 2013-06-15 DIAGNOSIS — N179 Acute kidney failure, unspecified: Secondary | ICD-10-CM | POA: Diagnosis present

## 2013-06-15 DIAGNOSIS — Z8673 Personal history of transient ischemic attack (TIA), and cerebral infarction without residual deficits: Secondary | ICD-10-CM

## 2013-06-15 DIAGNOSIS — D638 Anemia in other chronic diseases classified elsewhere: Secondary | ICD-10-CM | POA: Diagnosis present

## 2013-06-15 DIAGNOSIS — R5381 Other malaise: Secondary | ICD-10-CM | POA: Diagnosis present

## 2013-06-15 DIAGNOSIS — E039 Hypothyroidism, unspecified: Secondary | ICD-10-CM

## 2013-06-15 DIAGNOSIS — J40 Bronchitis, not specified as acute or chronic: Secondary | ICD-10-CM | POA: Diagnosis present

## 2013-06-15 DIAGNOSIS — Z993 Dependence on wheelchair: Secondary | ICD-10-CM

## 2013-06-15 DIAGNOSIS — Z9181 History of falling: Secondary | ICD-10-CM

## 2013-06-15 DIAGNOSIS — G9340 Encephalopathy, unspecified: Secondary | ICD-10-CM | POA: Diagnosis present

## 2013-06-15 DIAGNOSIS — N189 Chronic kidney disease, unspecified: Secondary | ICD-10-CM | POA: Diagnosis present

## 2013-06-15 DIAGNOSIS — F0390 Unspecified dementia without behavioral disturbance: Secondary | ICD-10-CM | POA: Diagnosis present

## 2013-06-15 DIAGNOSIS — K649 Unspecified hemorrhoids: Secondary | ICD-10-CM | POA: Diagnosis present

## 2013-06-15 DIAGNOSIS — I10 Essential (primary) hypertension: Secondary | ICD-10-CM

## 2013-06-15 DIAGNOSIS — I129 Hypertensive chronic kidney disease with stage 1 through stage 4 chronic kidney disease, or unspecified chronic kidney disease: Secondary | ICD-10-CM | POA: Diagnosis present

## 2013-06-15 DIAGNOSIS — Z66 Do not resuscitate: Secondary | ICD-10-CM

## 2013-06-15 DIAGNOSIS — I509 Heart failure, unspecified: Secondary | ICD-10-CM

## 2013-06-15 DIAGNOSIS — F29 Unspecified psychosis not due to a substance or known physiological condition: Secondary | ICD-10-CM

## 2013-06-15 DIAGNOSIS — R410 Disorientation, unspecified: Secondary | ICD-10-CM

## 2013-06-15 DIAGNOSIS — R1310 Dysphagia, unspecified: Secondary | ICD-10-CM | POA: Diagnosis present

## 2013-06-15 DIAGNOSIS — E785 Hyperlipidemia, unspecified: Secondary | ICD-10-CM

## 2013-06-15 DIAGNOSIS — J9819 Other pulmonary collapse: Secondary | ICD-10-CM | POA: Diagnosis present

## 2013-06-15 DIAGNOSIS — J069 Acute upper respiratory infection, unspecified: Secondary | ICD-10-CM

## 2013-06-15 DIAGNOSIS — I5033 Acute on chronic diastolic (congestive) heart failure: Principal | ICD-10-CM

## 2013-06-15 DIAGNOSIS — R531 Weakness: Secondary | ICD-10-CM

## 2013-06-15 DIAGNOSIS — Z515 Encounter for palliative care: Secondary | ICD-10-CM

## 2013-06-15 HISTORY — DX: Disorder of thyroid, unspecified: E07.9

## 2013-06-15 HISTORY — DX: Hyperlipidemia, unspecified: E78.5

## 2013-06-15 HISTORY — DX: Cerebral infarction, unspecified: I63.9

## 2013-06-15 HISTORY — DX: Essential (primary) hypertension: I10

## 2013-06-15 LAB — URINALYSIS, ROUTINE W REFLEX MICROSCOPIC
BILIRUBIN URINE: NEGATIVE
Glucose, UA: NEGATIVE mg/dL
Ketones, ur: NEGATIVE mg/dL
NITRITE: NEGATIVE
Protein, ur: NEGATIVE mg/dL
Specific Gravity, Urine: 1.012 (ref 1.005–1.030)
UROBILINOGEN UA: 0.2 mg/dL (ref 0.0–1.0)
pH: 5 (ref 5.0–8.0)

## 2013-06-15 LAB — CBC WITH DIFFERENTIAL/PLATELET
BASOS PCT: 0 % (ref 0–1)
Basophils Absolute: 0 10*3/uL (ref 0.0–0.1)
EOS ABS: 0 10*3/uL (ref 0.0–0.7)
Eosinophils Relative: 1 % (ref 0–5)
HCT: 26.1 % — ABNORMAL LOW (ref 36.0–46.0)
HEMOGLOBIN: 8.5 g/dL — AB (ref 12.0–15.0)
LYMPHS ABS: 0.9 10*3/uL (ref 0.7–4.0)
Lymphocytes Relative: 19 % (ref 12–46)
MCH: 30.7 pg (ref 26.0–34.0)
MCHC: 32.6 g/dL (ref 30.0–36.0)
MCV: 94.2 fL (ref 78.0–100.0)
MONO ABS: 0.7 10*3/uL (ref 0.1–1.0)
Monocytes Relative: 13 % — ABNORMAL HIGH (ref 3–12)
NEUTROS PCT: 67 % (ref 43–77)
Neutro Abs: 3.2 10*3/uL (ref 1.7–7.7)
Platelets: 112 10*3/uL — ABNORMAL LOW (ref 150–400)
RBC: 2.77 MIL/uL — ABNORMAL LOW (ref 3.87–5.11)
RDW: 16.8 % — ABNORMAL HIGH (ref 11.5–15.5)
WBC: 4.9 10*3/uL (ref 4.0–10.5)

## 2013-06-15 LAB — PRO B NATRIURETIC PEPTIDE: PRO B NATRI PEPTIDE: 458.6 pg/mL — AB (ref 0–450)

## 2013-06-15 LAB — URINE MICROSCOPIC-ADD ON

## 2013-06-15 LAB — COMPREHENSIVE METABOLIC PANEL
ALBUMIN: 3 g/dL — AB (ref 3.5–5.2)
ALT: 32 U/L (ref 0–35)
AST: 26 U/L (ref 0–37)
Alkaline Phosphatase: 59 U/L (ref 39–117)
BUN: 39 mg/dL — AB (ref 6–23)
CALCIUM: 9.2 mg/dL (ref 8.4–10.5)
CO2: 32 mEq/L (ref 19–32)
CREATININE: 1.16 mg/dL — AB (ref 0.50–1.10)
Chloride: 104 mEq/L (ref 96–112)
GFR calc non Af Amer: 39 mL/min — ABNORMAL LOW (ref >90)
GFR, EST AFRICAN AMERICAN: 46 mL/min — AB (ref >90)
GLUCOSE: 82 mg/dL (ref 70–99)
POTASSIUM: 4.5 meq/L (ref 3.7–5.3)
Sodium: 145 mEq/L (ref 137–147)
TOTAL PROTEIN: 6.4 g/dL (ref 6.0–8.3)
Total Bilirubin: <0.2 mg/dL — ABNORMAL LOW (ref 0.3–1.2)

## 2013-06-15 LAB — CBC
HEMATOCRIT: 27 % — AB (ref 36.0–46.0)
Hemoglobin: 8.6 g/dL — ABNORMAL LOW (ref 12.0–15.0)
MCH: 30.1 pg (ref 26.0–34.0)
MCHC: 31.9 g/dL (ref 30.0–36.0)
MCV: 94.4 fL (ref 78.0–100.0)
Platelets: 101 10*3/uL — ABNORMAL LOW (ref 150–400)
RBC: 2.86 MIL/uL — ABNORMAL LOW (ref 3.87–5.11)
RDW: 16.8 % — AB (ref 11.5–15.5)
WBC: 4.1 10*3/uL (ref 4.0–10.5)

## 2013-06-15 LAB — CREATININE, SERUM
Creatinine, Ser: 1.18 mg/dL — ABNORMAL HIGH (ref 0.50–1.10)
GFR calc Af Amer: 45 mL/min — ABNORMAL LOW (ref >90)
GFR calc non Af Amer: 39 mL/min — ABNORMAL LOW (ref >90)

## 2013-06-15 LAB — RETICULOCYTES
RBC.: 2.86 MIL/uL — ABNORMAL LOW (ref 3.87–5.11)
Retic Count, Absolute: 40 10*3/uL (ref 19.0–186.0)
Retic Ct Pct: 1.4 % (ref 0.4–3.1)

## 2013-06-15 LAB — OCCULT BLOOD, POC DEVICE: FECAL OCCULT BLD: POSITIVE — AB

## 2013-06-15 MED ORDER — ONDANSETRON HCL 4 MG PO TABS: 4.0000 mg | ORAL_TABLET | Freq: Four times a day (QID) | ORAL | Status: DC | PRN

## 2013-06-15 MED ORDER — BISMUTH SUBSALICYLATE 262 MG/15ML PO SUSP
30.0000 mL | Freq: Four times a day (QID) | ORAL | Status: DC | PRN
  Filled 2013-06-15: qty 236

## 2013-06-15 MED ORDER — DONEPEZIL HCL 10 MG PO TABS
10.0000 mg | ORAL_TABLET | Freq: Every day | ORAL | Status: DC
  Administered 2013-06-15 – 2013-06-20 (×6): 10 mg via ORAL
  Filled 2013-06-15 (×7): qty 1

## 2013-06-15 MED ORDER — ONDANSETRON HCL 4 MG/2ML IJ SOLN: 4.0000 mg | Freq: Four times a day (QID) | INTRAMUSCULAR | Status: DC | PRN

## 2013-06-15 MED ORDER — LEVOFLOXACIN IN D5W 500 MG/100ML IV SOLN
500.0000 mg | INTRAVENOUS | Status: DC
  Administered 2013-06-15: 500 mg via INTRAVENOUS
  Filled 2013-06-15 (×2): qty 100

## 2013-06-15 MED ORDER — POTASSIUM CHLORIDE CRYS ER 10 MEQ PO TBCR
10.0000 meq | EXTENDED_RELEASE_TABLET | ORAL | Status: DC
  Administered 2013-06-16: 10 meq via ORAL
  Filled 2013-06-15 (×2): qty 1

## 2013-06-15 MED ORDER — ACETAMINOPHEN 650 MG RE SUPP
650.0000 mg | Freq: Four times a day (QID) | RECTAL | Status: DC | PRN
Start: 2013-06-15 — End: 2013-06-22

## 2013-06-15 MED ORDER — ZOLPIDEM TARTRATE 5 MG PO TABS: 5.0000 mg | ORAL_TABLET | Freq: Every evening | ORAL | Status: DC | PRN

## 2013-06-15 MED ORDER — FUROSEMIDE 10 MG/ML IJ SOLN
20.0000 mg | Freq: Once | INTRAMUSCULAR | Status: AC
  Administered 2013-06-15: 20 mg via INTRAVENOUS
  Filled 2013-06-15: qty 2

## 2013-06-15 MED ORDER — FUROSEMIDE 10 MG/ML IJ SOLN
20.0000 mg | Freq: Two times a day (BID) | INTRAMUSCULAR | Status: DC
  Administered 2013-06-15 – 2013-06-16 (×2): 20 mg via INTRAVENOUS
  Filled 2013-06-15 (×2): qty 2

## 2013-06-15 MED ORDER — FERROUS SULFATE 220 (44 FE) MG/5ML PO ELIX: 220.0000 mg | ORAL_SOLUTION | Freq: Two times a day (BID) | ORAL | Status: DC

## 2013-06-15 MED ORDER — ATORVASTATIN CALCIUM 20 MG PO TABS
20.0000 mg | ORAL_TABLET | Freq: Every day | ORAL | Status: DC
  Administered 2013-06-16 – 2013-06-20 (×4): 20 mg via ORAL
  Filled 2013-06-15 (×6): qty 1

## 2013-06-15 MED ORDER — OXYCODONE HCL 5 MG PO TABS: 5.0000 mg | ORAL_TABLET | ORAL | Status: DC | PRN

## 2013-06-15 MED ORDER — LEVOTHYROXINE SODIUM 137 MCG PO TABS
137.0000 ug | ORAL_TABLET | Freq: Every day | ORAL | Status: DC
  Administered 2013-06-16 – 2013-06-21 (×6): 137 ug via ORAL
  Filled 2013-06-15 (×7): qty 1

## 2013-06-15 MED ORDER — FERROUS SULFATE 300 (60 FE) MG/5ML PO SYRP
300.0000 mg | ORAL_SOLUTION | Freq: Two times a day (BID) | ORAL | Status: DC
  Administered 2013-06-16 – 2013-06-21 (×10): 300 mg via ORAL
  Filled 2013-06-15 (×13): qty 5

## 2013-06-15 MED ORDER — GUAIFENESIN 100 MG/5ML PO SYRP
200.0000 mg | ORAL_SOLUTION | ORAL | Status: DC | PRN
  Administered 2013-06-15 – 2013-06-17 (×4): 200 mg via ORAL
  Filled 2013-06-15 (×4): qty 10

## 2013-06-15 MED ORDER — HEPARIN SODIUM (PORCINE) 5000 UNIT/ML IJ SOLN
5000.0000 [IU] | Freq: Three times a day (TID) | INTRAMUSCULAR | Status: DC
  Administered 2013-06-15 – 2013-06-21 (×16): 5000 [IU] via SUBCUTANEOUS
  Filled 2013-06-15 (×20): qty 1

## 2013-06-15 MED ORDER — CLONAZEPAM 0.5 MG PO TABS: 0.2500 mg | ORAL_TABLET | Freq: Three times a day (TID) | ORAL | Status: DC | PRN

## 2013-06-15 MED ORDER — ALBUTEROL SULFATE (2.5 MG/3ML) 0.083% IN NEBU: 2.5000 mg | INHALATION_SOLUTION | RESPIRATORY_TRACT | Status: DC | PRN

## 2013-06-15 MED ORDER — ACETAMINOPHEN 325 MG PO TABS
650.0000 mg | ORAL_TABLET | Freq: Four times a day (QID) | ORAL | Status: DC | PRN
Start: 2013-06-15 — End: 2013-06-21
  Administered 2013-06-20: 650 mg via ORAL
  Filled 2013-06-15: qty 2

## 2013-06-15 MED ORDER — SODIUM CHLORIDE 0.9 % IJ SOLN
3.0000 mL | Freq: Two times a day (BID) | INTRAMUSCULAR | Status: DC
  Administered 2013-06-15 – 2013-06-22 (×9): 3 mL via INTRAVENOUS

## 2013-06-15 MED ORDER — NITROFURANTOIN MACROCRYSTAL 50 MG PO CAPS: 50.0000 mg | ORAL_CAPSULE | Freq: Every day | ORAL | Status: DC

## 2013-06-15 MED ORDER — ALUM & MAG HYDROXIDE-SIMETH 200-200-20 MG/5ML PO SUSP: 30.0000 mL | Freq: Four times a day (QID) | ORAL | Status: DC | PRN

## 2013-06-15 NOTE — ED Notes (Signed)
Patient taken to X-Ray.

## 2013-06-15 NOTE — ED Notes (Signed)
EMS called by caregiver for Shortness of Breath. Upon arrival, EMS reports no shortness of breath. Per EMS, Patient has not taken Lasix in three days. Patient has noted edema bilaterally in both lower extremities. Vital Signs per EMS 117/56, 58 Pulse, RR 16, 98% on Room Air, CBG 77. Patient alert and talking to [place]. History of Thyroid Disease, Hyperlipidemia, and CVA/Stroke. Patient is on Warfarin.

## 2013-06-15 NOTE — ED Provider Notes (Signed)
CSN: [id number]     Arrival date & time 06/15/13  1524 History   First MD Initiated Contact with Patient 06/15/13 1525     Chief Complaint  Patient presents with  . Leg Swelling   (Consider location/radiation/quality/duration/timing/severity/associated sxs/prior Treatment) HPI Comments: Daughter reports 1 day of cough, congestion, rhinorrhea, increased fatigue, weakness.  She has been on lyrica for about 1 week and has had increased garbled speech since that time which is worse today.  She reports she seems a little confused, answers questions more slowly than usual.   Patient is a 80 y.o. female presenting with cough. The history is provided by the patient and a caregiver. No language interpreter was used.  Cough Cough characteristics:  Non-productive Severity:  Moderate Duration:  1 day Timing:  Intermittent Progression:  Worsening Chronicity:  New Smoker: no   Context: sick contacts   Relieved by:  Nothing Worsened by:  Nothing tried Ineffective treatments:  None tried Associated symptoms: rhinorrhea and shortness of breath   Associated symptoms: no chest pain, no chills, no diaphoresis, no eye discharge, no fever, no headaches, no myalgias and no sore throat   Associated symptoms comment:  Fatigue, generalized weakness, diarrhea x2 Rhinorrhea:    Quality:  Clear   Severity:  Moderate   Duration:  1 day   Progression:  Unchanged Shortness of breath:    Severity:  Unable to specify   Onset quality:  Unable to specify   Duration:  1 day   Timing:  Unable to specify   Progression:  Unable to specify   Past Medical History  Diagnosis Date  . Hypertension   . Stroke   . Thyroid disease   . Hyperlipidemia    Past Surgical History  Procedure Laterality Date  . Bladder suspension     Family History  Problem Relation Age of Onset  . Heart failure Sister   . Cancer Brother    History  Substance Use Topics  . Smoking status: Never Smoker   . Smokeless tobacco: Not on  file  . Alcohol Use: No   OB History   Grav Para Term Preterm Abortions TAB SAB Ect Mult Living                 Review of Systems  Constitutional: Positive for fatigue. Negative for fever, chills, diaphoresis, activity change and appetite change.  HENT: Positive for rhinorrhea. Negative for congestion, facial swelling and sore throat.   Eyes: Negative for photophobia and discharge.  Respiratory: Positive for cough and shortness of breath. Negative for chest tightness.   Cardiovascular: Positive for leg swelling. Negative for chest pain and palpitations.  Gastrointestinal: Positive for diarrhea. Negative for nausea, vomiting and abdominal pain.  Endocrine: Negative for polydipsia and polyuria.  Genitourinary: Negative for dysuria, frequency, difficulty urinating and pelvic pain.  Musculoskeletal: Negative for arthralgias, back pain, myalgias, neck pain and neck stiffness.  Skin: Negative for color change and wound.  Allergic/Immunologic: Negative for immunocompromised state.  Neurological: Positive for speech difficulty and weakness. Negative for facial asymmetry, numbness and headaches.  Hematological: Does not bruise/bleed easily.  Psychiatric/Behavioral: Positive for confusion. Negative for agitation.    Allergies  Aspirin  Home Medications   No current outpatient prescriptions on file. BP 107/57  Pulse 70  Temp(Src) 97 F (36.1 C) (Axillary)  Resp 18  Ht 5\' 10"  (1.778 m)  Wt 190 lb 7.6 oz (86.4 kg)  BMI 27.33 kg/m2  SpO2 98% Physical Exam  Constitutional: She is oriented  to person, place, and time. She appears well-developed and well-nourished. She appears listless.  Non-toxic appearance. No distress.  HENT:  Head: Normocephalic and atraumatic.  Mouth/Throat: No oropharyngeal exudate.  Mild periorbital edema BL  Eyes: Pupils are equal, round, and reactive to light.  Neck: Normal range of motion. Neck supple.  Cardiovascular: Normal rate, regular rhythm and normal  heart sounds.  Exam reveals no gallop and no friction rub.   No murmur heard. Pulmonary/Chest: Effort normal and breath sounds normal. No respiratory distress. She has no wheezes. She has no rales.  Abdominal: Soft. Bowel sounds are normal. She exhibits no distension and no mass. There is no tenderness. There is no rebound and no guarding.  Musculoskeletal: Normal range of motion. She exhibits no edema and no tenderness.  Neurological: She is oriented to person, place, and time. She has normal strength. She appears listless. No cranial nerve deficit or sensory deficit. Coordination normal. Abnormal gait: not tested. GCS eye subscore is 4. GCS verbal subscore is 4. GCS motor subscore is 6.  BL knee contractures.  Does not ambulate at baseline, but able to lift legs w/ equal strength  Skin: Skin is warm and dry.  Psychiatric: She has a normal mood and affect.    ED Course  Procedures (including critical care time) Labs Review Labs Reviewed  CBC WITH DIFFERENTIAL - Abnormal; Notable for the following:    RBC 2.77 (*)    Hemoglobin 8.5 (*)    HCT 26.1 (*)    RDW 16.8 (*)    Platelets 112 (*)    Monocytes Relative 13 (*)    All other components within normal limits  COMPREHENSIVE METABOLIC PANEL - Abnormal; Notable for the following:    BUN 39 (*)    Creatinine, Ser 1.16 (*)    Albumin 3.0 (*)    Total Bilirubin <0.2 (*)    GFR calc non Af Amer 39 (*)    GFR calc Af Amer 46 (*)    All other components within normal limits  PRO B NATRIURETIC PEPTIDE - Abnormal; Notable for the following:    Pro B Natriuretic peptide (BNP) 458.6 (*)    All other components within normal limits  URINALYSIS, ROUTINE W REFLEX MICROSCOPIC - Abnormal; Notable for the following:    APPearance CLOUDY (*)    Hgb urine dipstick LARGE (*)    Leukocytes, UA SMALL (*)    All other components within normal limits  CBC - Abnormal; Notable for the following:    RBC 2.86 (*)    Hemoglobin 8.6 (*)    HCT 27.0 (*)     RDW 16.8 (*)    Platelets 101 (*)    All other components within normal limits  CREATININE, SERUM - Abnormal; Notable for the following:    Creatinine, Ser 1.18 (*)    GFR calc non Af Amer 39 (*)    GFR calc Af Amer 45 (*)    All other components within normal limits  BASIC METABOLIC PANEL - Abnormal; Notable for the following:    BUN 40 (*)    GFR calc non Af Amer 42 (*)    GFR calc Af Amer 49 (*)    All other components within normal limits  CBC - Abnormal; Notable for the following:    RBC 2.86 (*)    Hemoglobin 8.7 (*)    HCT 27.2 (*)    RDW 17.1 (*)    Platelets 118 (*)    All other components  within normal limits  VITAMIN B12 - Abnormal; Notable for the following:    Vitamin B-12 1021 (*)    All other components within normal limits  RETICULOCYTES - Abnormal; Notable for the following:    RBC. 2.86 (*)    All other components within normal limits  OCCULT BLOOD, POC DEVICE - Abnormal; Notable for the following:    Fecal Occult Bld POSITIVE (*)    All other components within normal limits  URINE CULTURE  RESPIRATORY VIRUS PANEL  URINE MICROSCOPIC-ADD ON  FOLATE  FERRITIN  IRON AND TIBC  HEMOGLOBIN AND HEMATOCRIT, BLOOD   Imaging Review Dg Chest 2 View  06/15/2013   CLINICAL DATA:  Leg swelling, cough.  EXAM: CHEST  2 VIEW  COMPARISON:  08/26/2007  FINDINGS: Cardiomegaly. Vascular congestion. No overt edema. No confluent opacities. Suspect trace effusions posteriorly on the lateral view. No acute bony abnormality.  IMPRESSION: Cardiomegaly with vascular congestion and trace bilateral effusions.   Electronically Signed   By: Rolm Baptise M.D.   On: 06/15/2013 17:37   Ct Head Wo Contrast  06/16/2013   CLINICAL DATA:  Confusion.  EXAM: CT HEAD WITHOUT CONTRAST  TECHNIQUE: Contiguous axial images were obtained from the base of the skull through the vertex without intravenous contrast.  COMPARISON:  11/16/2007  FINDINGS: Right occipital encephalomalacia is again seen,  consistent with remote infarct. Mild age-related cerebral atrophy is present. There is no evidence of acute cortical infarct, mass, midline shift, intracranial hemorrhage, or extra-axial fluid collection. Prior bilateral cataract surgery is noted. Mastoid air cells and visualized paranasal sinuses are clear.  IMPRESSION: No evidence of acute intracranial abnormality.   Electronically Signed   By: Logan Bores   On: 06/16/2013 00:48      MDM   1. CHF (congestive heart failure)   2. Influenza-like illness   3. Altered mental status   4. Anemia   5. Confusion   6. HTN (hypertension)   7. Hyperlipemia   8. Hypothyroid   9. URI (upper respiratory infection)   10. Weakness    Pt is a 80 y.o. female with Pmhx as above who presents with 1 day of cough, congestion, chills, rhinorrhea, inc generalized weakness leading to fall last night, fatigue, inc LE edema & facial edema, AMS.  On PE, afebrile, VSS.  Pt is listness, GCS 14.  No focal neuro findings. She has BLLE edema. Rectal w/ slightly oozing hemorrhoid.  Rectal heme +.  Hb 8.7.  WBC nml.  CXR with cardiomegaly, vascular congestion & BL trace effusions. BNP mildly elevated.  Suspect mild CHF exacerbation as well as likely influenza-like illness.  Symptoms may be complicated by new anemia.  Given age, unclear clinical picture, I do not feel she is appropriate for d/c.  [facility] will admit.         Neta Ehlers, MD 06/16/13 1351

## 2013-06-15 NOTE — ED Notes (Signed)
Occult Blood Stool Collection card at the bedside. Waiting for MD to perform.

## 2013-06-15 NOTE — ED Notes (Signed)
When taken off the bed pan, blood noted on the edge where daughter states, "her hemorrhoids are bleeding."

## 2013-06-15 NOTE — H&P (Addendum)
Patient Demographics  Alexandra Lucas, is a 80 y.o. female  MRN: [id number]   DOB - 02-28-1918  Admit Date - 06/15/2013  Outpatient Primary MD for the patient is Maggie Font, MD   With History of -  Past Medical History  Diagnosis Date  . Hypertension   . Stroke   . Thyroid disease   . Hyperlipidemia       Past Surgical History  Procedure Laterality Date  . Bladder suspension      in for   Chief Complaint  Patient presents with  . Leg Swelling     HPI  Alexandra Lucas  is a 80 y.o. female, with history of mild dementia, wheelchair dependent, brought by her daughter for multiple complaints, including generalized weakness over the last week, with one fall during transfer from bed to wheelchair, cough for last 24 hours,, generalized weakness and confusion, and shortness of breath, daughter reports she had upper respiratory infection recently, as well noticed in her mother having diarrhea x24 hours, patient chest x-ray did not show any acute findings, did not have leukocytosis, her labs were significant for anemia of hemoglobin 8.5, last hemoglobin we have in the records was before 2 years and was level of 10, but daughter reports she had low hemoglobin, and was told this by her PCP, but she cannot recall the level exactly, patient had a rectal exam done by ED physician, was noticed to have large hemorrhoids, with minimal oozing from side, stool are normal in color, so patient was Hemoccult positive, there is no complaints of focal deficits or tingling or numbness, as well patient found to have volume congestion on her chest x-ray and complaint of lower extremity edema, no history of CHF in the past.   Review of Systems    In addition to the HPI above,  Patient is a very poor historian was obtained to my  best. No Fever-chills, No Headache, No changes with Vision or hearing, No problems swallowing food or Liquids, No Chest pain, complain of Cough or Shortness of Breath, No Abdominal pain, No Nausea or Vommitting, Bowel movements are regular, As hemorrhoids with minimal bleed No dysuria, No new skin rashes or bruises, History significant for arthritis nonambulatory at baseline, only transfer with assistance from wheelchair to bed No new focal weakness, tingling, numbness in any extremity, has generalized weakness No recent weight gain or loss, No polyuria, polydypsia or polyphagia, No significant Mental Stressors.  A full 10 point Review of Systems was done, except as stated above, all other Review of Systems were negative.   Social History History  Substance Use Topics  . Smoking status: Never Smoker   . Smokeless tobacco: Not on file  . Alcohol Use: No     Family History Family History  Problem Relation Age of Onset  . Heart failure Sister   . Cancer Brother      Prior to Admission medications   Medication Sig Start Date  End Date Taking? Authorizing Provider  amLODipine (NORVASC) 2.5 MG tablet Take 2.5 mg by mouth daily.   Yes Historical Provider, MD  bismuth subsalicylate (PEPTO BISMOL) 262 MG/15ML suspension Take 30 mLs by mouth every 6 (six) hours as needed for indigestion or diarrhea or loose stools.    Yes Historical Provider, MD  clonazePAM (KLONOPIN) 1 MG tablet Take 1 mg by mouth 3 (three) times daily as needed for anxiety.   Yes Historical Provider, MD  diclofenac sodium (VOLTAREN) 1 % GEL Apply 4 g topically at bedtime.   Yes Historical Provider, MD  donepezil (ARICEPT) 10 MG tablet Take 10 mg by mouth at bedtime.   Yes Historical Provider, MD  furosemide (LASIX) 20 MG tablet Take 20 mg by mouth daily.   Yes Historical Provider, MD  gabapentin (NEURONTIN) 100 MG capsule Take 100 mg by mouth 3 (three) times daily.   Yes Historical Provider, MD    HYDROcodone-acetaminophen (NORCO/VICODIN) 5-325 MG per tablet Take 1 tablet by mouth every 6 (six) hours as needed for moderate pain.   Yes Historical Provider, MD  hydrOXYzine (VISTARIL) 25 MG capsule Take 25 mg by mouth at bedtime.   Yes Historical Provider, MD  levothyroxine (SYNTHROID, LEVOTHROID) 137 MCG tablet Take 137 mcg by mouth daily before breakfast.   Yes Historical Provider, MD  nitrofurantoin (MACRODANTIN) 50 MG capsule Take 50 mg by mouth daily.    Yes Historical Provider, MD  potassium chloride (K-DUR,KLOR-CON) 10 MEQ tablet Take 10 mEq by mouth once a week.   Yes Historical Provider, MD  pregabalin (LYRICA) 75 MG capsule Take 75 mg by mouth 2 (two) times daily.   Yes Historical Provider, MD  rosuvastatin (CRESTOR) 10 MG tablet Take 10 mg by mouth daily.   Yes Historical Provider, MD  temazepam (RESTORIL) 15 MG capsule Take 15 mg by mouth at bedtime.   Yes Historical Provider, MD  valsartan-hydrochlorothiazide (DIOVAN-HCT) 320-25 MG per tablet Take 1 tablet by mouth daily.   Yes Historical Provider, MD    Allergies  Allergen Reactions  . Aspirin Nausea Only    Physical Exam  Vitals  Blood pressure 119/56, pulse 57, temperature 98.4 F (36.9 C), temperature source Oral, resp. rate 16, SpO2 99.00%.   1. General elderly female lying in bed in NAD,    2. flataffect ,confused, l, Awake Alert, Oriented X 2.  3. No F.N deficits, ALL C.Nerves Intact, has general weakness in all of her extremities, but able to move them grossly without any significant deficits noted  4. Ears and Eyes appear Normal, Conjunctivae clear, PERRLA. Moist Oral Mucosa.  5. Supple Neck, No JVD, No cervical lymphadenopathy appriciated, No Carotid Bruits.  6. Symmetrical Chest wall movement, Good air movement bilaterally, CTAB.  7. RRR, No Gallops, Rubs or Murmurs, No Parasternal Heave.  8. Positive Bowel Sounds, Abdomen Soft, Non tender, No organomegaly appriciated,No rebound -guarding or  rigidity.  9.  No Cyanosis, Normal Skin Turgor, No Skin Rash or Bruise.  10. Good muscle tone,  joints appear normal , no effusions, Normal ROM.  11. No Palpable Lymph Nodes in  Axillae    Data Review  CBC  Recent Labs Lab 06/15/13 1615  WBC 4.9  HGB 8.5*  HCT 26.1*  PLT 112*  MCV 94.2  MCH 30.7  MCHC 32.6  RDW 16.8*  LYMPHSABS 0.9  MONOABS 0.7  EOSABS 0.0  BASOSABS 0.0   ------------------------------------------------------------------------------------------------------------------  Chemistries   Recent Labs Lab 06/15/13 1615  NA 145  K 4.5  CL 104  CO2 32  GLUCOSE 82  BUN 39*  CREATININE 1.16*  CALCIUM 9.2  AST 26  ALT 32  ALKPHOS 12  BILITOT <0.2*   ------------------------------------------------------------------------------------------------------------------ CrCl is unknown because there is no height on file for the current visit. ------------------------------------------------------------------------------------------------------------------ No results found for this basename: TSH, T4TOTAL, FREET3, T3FREE, THYROIDAB,  in the last 72 hours   Coagulation profile No results found for this basename: INR, PROTIME,  in the last 168 hours ------------------------------------------------------------------------------------------------------------------- No results found for this basename: DDIMER,  in the last 72 hours -------------------------------------------------------------------------------------------------------------------  Cardiac Enzymes No results found for this basename: CK, CKMB, TROPONINI, MYOGLOBIN,  in the last 168 hours ------------------------------------------------------------------------------------------------------------------ No components found with this basename: POCBNP,    ---------------------------------------------------------------------------------------------------------------  Urinalysis    Component Value  Date/Time   COLORURINE YELLOW 06/15/2013 1701   APPEARANCEUR CLOUDY* 06/15/2013 1701   LABSPEC 1.012 06/15/2013 1701   PHURINE 5.0 06/15/2013 1701   GLUCOSEU NEGATIVE 06/15/2013 1701   HGBUR LARGE* 06/15/2013 1701   BILIRUBINUR NEGATIVE 06/15/2013 1701   KETONESUR NEGATIVE 06/15/2013 1701   PROTEINUR NEGATIVE 06/15/2013 1701   UROBILINOGEN 0.2 06/15/2013 1701   NITRITE NEGATIVE 06/15/2013 1701   LEUKOCYTESUR SMALL* 06/15/2013 1701    ----------------------------------------------------------------------------------------------------------------  Imaging results:   Dg Chest 2 View  06/15/2013   CLINICAL DATA:  Leg swelling, cough.  EXAM: CHEST  2 VIEW  COMPARISON:  08/26/2007  FINDINGS: Cardiomegaly. Vascular congestion. No overt edema. No confluent opacities. Suspect trace effusions posteriorly on the lateral view. No acute bony abnormality.  IMPRESSION: Cardiomegaly with vascular congestion and trace bilateral effusions.   Electronically Signed   By: Rolm Baptise M.D.   On: 06/15/2013 17:37        Assessment & Plan  Active Problems:   CHF (congestive heart failure)   URI (upper respiratory infection)   HTN (hypertension)   Weakness   Confusion   Hyperlipemia   Hypothyroid   Anemia    1. congestive heart failure: Patient has vascular congestion her chest x-ray, as well as lower extremity edema, has not been taking her Lasix for the last few days, patient will be admitted to telemetry, will start on gentle hydration with Lasix 20 mg IV every 12 hours, will check 2-D echo. 2. Anemia. most likely anemia of chronic disease, will check anemia panel, will monitor hemoglobin every 8 hours, patient is Hemoccult positive to do to mild hemorrhoidal bleed, will start her on iron supplement 3. Weakness. patient has generalized weakness, no focal deficits, this is most likely related to her congestive heart failure, and anemia, and possible viral illness. Causing her cough and diarrhea. As well URI. 4.  Confusion: Most likely metabolic, it due to her infection, as well possibly medication induced as patient appears to be on a lot of pain and sedation medicatin, will hold Lyrica, as most of her symptoms, and size with restarting Lyrica on the 23rd of last month, as well will decrease her clonipin(can't stop it all of a sudden as she's been on it for a few years), will hold her Vicodin, and temazepam. 5. Hyperlipidemia. Continue with statin 6. Hypertension. Will hold patient home medication especially her blood pressure is on the lower side, as well she is on IV Lasix 7. Hypothyroidism. Continue with Synthroid 8. URI: on levofloxacine  DVT Prophylaxis Heparin (will monitor her hemoglobin closely, and if continues to drop will stop subcutaneous heparin  AM Labs Ordered, also please review Full Orders  Family Communication: Admission, patients  condition and plan of care including tests being ordered have been discussed with the patient and daughter who indicate understanding and agree with the plan and Code Status.  Code Status full  Likely DC to  home versus SNF  Condition GUARDED   Time spent in minutes : 10min    Vira Chaplin M.D on 06/15/2013 at 7:42 PM    And look for the night coverage person covering me after hours  [facility] Office  [phone]

## 2013-06-15 NOTE — ED Notes (Addendum)
Patient returned from [place]. Patient put back on the monitor and made comfortable.

## 2013-06-16 DIAGNOSIS — R5381 Other malaise: Secondary | ICD-10-CM

## 2013-06-16 DIAGNOSIS — J069 Acute upper respiratory infection, unspecified: Secondary | ICD-10-CM

## 2013-06-16 DIAGNOSIS — I359 Nonrheumatic aortic valve disorder, unspecified: Secondary | ICD-10-CM

## 2013-06-16 DIAGNOSIS — R5383 Other fatigue: Secondary | ICD-10-CM

## 2013-06-16 LAB — VITAMIN B12: Vitamin B-12: 1021 pg/mL — ABNORMAL HIGH (ref 211–911)

## 2013-06-16 LAB — BASIC METABOLIC PANEL
BUN: 40 mg/dL — AB (ref 6–23)
CO2: 31 mEq/L (ref 19–32)
CREATININE: 1.1 mg/dL (ref 0.50–1.10)
Calcium: 9.5 mg/dL (ref 8.4–10.5)
Chloride: 102 mEq/L (ref 96–112)
GFR calc Af Amer: 49 mL/min — ABNORMAL LOW (ref >90)
GFR calc non Af Amer: 42 mL/min — ABNORMAL LOW (ref >90)
Glucose, Bld: 92 mg/dL (ref 70–99)
Potassium: 4 mEq/L (ref 3.7–5.3)
Sodium: 145 mEq/L (ref 137–147)

## 2013-06-16 LAB — CBC
HCT: 27.2 % — ABNORMAL LOW (ref 36.0–46.0)
Hemoglobin: 8.7 g/dL — ABNORMAL LOW (ref 12.0–15.0)
MCH: 30.4 pg (ref 26.0–34.0)
MCHC: 32 g/dL (ref 30.0–36.0)
MCV: 95.1 fL (ref 78.0–100.0)
Platelets: 118 10*3/uL — ABNORMAL LOW (ref 150–400)
RBC: 2.86 MIL/uL — ABNORMAL LOW (ref 3.87–5.11)
RDW: 17.1 % — AB (ref 11.5–15.5)
WBC: 4 10*3/uL (ref 4.0–10.5)

## 2013-06-16 LAB — URINE CULTURE: Colony Count: 50000

## 2013-06-16 LAB — HEMOGLOBIN AND HEMATOCRIT, BLOOD
HCT: 26 % — ABNORMAL LOW (ref 36.0–46.0)
Hemoglobin: 8.4 g/dL — ABNORMAL LOW (ref 12.0–15.0)

## 2013-06-16 LAB — IRON AND TIBC
Iron: 71 ug/dL (ref 42–135)
SATURATION RATIOS: 24 % (ref 20–55)
TIBC: 297 ug/dL (ref 250–470)
UIBC: 226 ug/dL (ref 125–400)

## 2013-06-16 LAB — FERRITIN: Ferritin: 288 ng/mL (ref 10–291)

## 2013-06-16 LAB — FOLATE: FOLATE: 11 ng/mL

## 2013-06-16 MED ORDER — HYDROCORTISONE 2.5 % RE CREA
TOPICAL_CREAM | Freq: Two times a day (BID) | RECTAL | Status: DC
  Administered 2013-06-16 – 2013-06-22 (×12): via RECTAL
  Filled 2013-06-16: qty 28.35

## 2013-06-16 MED ORDER — SODIUM CHLORIDE 0.9 % IV SOLN
INTRAVENOUS | Status: AC
  Administered 2013-06-16 (×2): via INTRAVENOUS

## 2013-06-16 NOTE — Evaluation (Addendum)
Physical Therapy Evaluation Patient Details Name: CHRISTABELLE HANZLIK MRN: [id number] DOB: Nov 25, 1917 Today's Date: 06/16/2013 Time: 4782-9562 PT Time Calculation (min): 28 min  PT Assessment / Plan / Recommendation History of Present Illness  Patient is a 80 yo female admitted with CHF, anemia, confusion, weakness.  Patient with h/o mild dementia.  Clinical Impression  Patient presents with problems listed below.  Will benefit from acute PT to maximize mobility prior to discharge. Recommend SNF for continued therapy at discharge - daughter unable to care for patient at current functional level.    PT Assessment  Patient needs continued PT services    Follow Up Recommendations  SNF;Supervision/Assistance - 24 hour    Does the patient have the potential to tolerate intense rehabilitation      Barriers to Discharge Decreased caregiver support At this point, patient requires 24 hour assist of 2 people.  Daughter works part-time job.    Equipment Recommendations  Hospital bed (Lift equipment)    Recommendations for Other Services     Frequency Min 3X/week    Precautions / Restrictions Precautions Precautions: Fall Precaution Comments: Had fall at home Restrictions Weight Bearing Restrictions: No   Pertinent Vitals/Pain       Mobility  Bed Mobility Overal bed mobility: Needs Assistance Bed Mobility: Supine to Sit;Sit to Supine Supine to sit: Max assist;HOB elevated Sit to supine: Total assist;+2 for physical assistance General bed mobility comments: Verbal and tactile cues for technique.  Patient able to move LE's off of bed with increased time.  Assist to bring trunk to sitting position.  Total assist to scoot to EOB using bed pads.  Worked on sitting balance x 12 minutes (see balance section).  Provided verbal and tactile cues to return to supine.  Patient with difficulty following commands for sequencing.  Required +2 assist.    Exercises     PT Diagnosis: Generalized  weakness;Acute pain;Altered mental status  PT Problem List: Decreased strength;Decreased activity tolerance;Decreased balance;Decreased mobility;Decreased cognition;Decreased knowledge of use of DME;Decreased safety awareness;Cardiopulmonary status limiting activity;Obesity;Pain PT Treatment Interventions: Functional mobility training;Therapeutic exercise;Balance training;Cognitive remediation;Patient/family education     PT Goals(Current goals can be found in the care plan section) Acute Rehab PT Goals Patient Stated Goal: None stated by patient; Daughter - safe discharge plan PT Goal Formulation: With patient/family Time For Goal Achievement: 06/30/13 Potential to Achieve Goals: Fair  Visit Information  Last PT Received On: 06/16/13 Assistance Needed: +2 History of Present Illness: Patient is a 80 yo female admitted with CHF, anemia, confusion, weakness.  Patient with h/o mild dementia.       Prior Functioning  Home Living Family/patient expects to be discharged to:: Skilled nursing facility Living Arrangements: Children (Daughter) Home Equipment: Gilford Rile - 2 wheels;Walker - 4 wheels;Electric scooter;Toilet riser;Bedside commode;Tub bench;Wheelchair - manual Prior Function Level of Independence: Independent with assistive device(s);Needs assistance Gait / Transfers Assistance Needed: Able to transfer independently pta (scooting transfers).  Non-ambulatory. ADL's / Homemaking Assistance Needed: Assist for bathing/dressing, to cook meals and for housekeeping Comments: Patient was able to transfer from bed <> w/c <> toilet on her own.  She was able to propel her w/c independently inside the house.  She was able to be at home alone for several hours during the day while daughter worked (part-time). Communication Communication: Expressive difficulties (Difficulty understanding patient's speech)    Cognition  Cognition Arousal/Alertness: Lethargic Behavior During Therapy: Flat  affect;Anxious Overall Cognitive Status: Impaired/Different from baseline Area of Impairment: Orientation;Following commands;Safety/judgement;Awareness Orientation Level: Disoriented to;Time;Situation  Memory: Decreased short-term memory Following Commands: Follows one step commands inconsistently;Follows one step commands with increased time Safety/Judgement: Decreased awareness of safety General Comments: Daughter reports patient is much different from baseline    Extremity/Trunk Assessment Upper Extremity Assessment Upper Extremity Assessment: Generalized weakness Lower Extremity Assessment Lower Extremity Assessment: RLE deficits/detail;LLE deficits/detail RLE Deficits / Details: Strength grossly 3-/5; Decreased knee extension due to pain. RLE: Unable to fully assess due to pain RLE Coordination: decreased gross motor LLE Deficits / Details: Strength grossly 3-/5; Decreased knee extension due to pain. LLE: Unable to fully assess due to pain LLE Coordination: decreased gross motor   Balance Balance Overall balance assessment: Needs assistance Sitting-balance support: Single extremity supported;Feet supported Sitting balance-Leahy Scale: Poor Sitting balance - Comments: Patient sat EOB x 12 minutes working on static sitting balance/posture.  Patient leaning posteriorly, requiring min - mod assist to maintain balance.  Patient unable to follow verbal or visual commands to lean forward.  Did get patient to shift trunk forward by asking her to touch her nose to PT's hand.  She was then able to bring trunk forward, but unable to maintain position for more than 3 seconds. Postural control: Posterior lean  End of Session PT - End of Session Activity Tolerance: Patient limited by lethargy;Patient limited by fatigue Patient left: in bed;with call bell/phone within reach;with family/visitor present Nurse Communication: Mobility status  GP     Despina Pole 06/16/2013, 4:51 PM Carita Pian. Sanjuana Kava, [place] Pager [phone]

## 2013-06-16 NOTE — Progress Notes (Signed)
Patient arrived to unit via stretcher. Alert and oriented. Speech slightly slurred with heavy foreign accent. Daughter with patient.  No complaints of pain. Resting quietly.

## 2013-06-16 NOTE — Progress Notes (Signed)
Utilization review completed.  

## 2013-06-16 NOTE — Progress Notes (Signed)
PROGRESS NOTE  Alexandra Lucas TDD:[id number] DOB: October 27, 1917 DOA: 06/15/2013 PCP: Maggie Font, MD  Assessment/Plan: congestive heart failure:  -vascular congestion her chest x-ray, as well as lower extremity edema, has not been taking her Lasix for the last few days -BNP only mildly elevated  -Lasix 20 mg IV every 12 hours- consider change to PO tomm -2-D echo.   Anemia. most likely anemia of chronic disease - anemia panel -monitor hemoglobin every 8 hours -Hemoccult positive to do to mild hemorrhoidal bleed  Weakness.  -most likely related to her congestive heart failure/anemia/possible viral illness  -PT eval  Confusion:  -d/c lyrica -decrease clonopin  Hyperlipidemia. Continue with statin    Hypertension. Will hold patient home medication especially her blood pressure is on the lower side, as well she is on IV Lasix   Hypothyroidism. Continue with Synthroid   URI: levaquin  H/o MGUS  Code Status: full Family Communication: patient- no family at bedside Disposition Plan:  PT eval   Consultants:  none  Procedures:  echo  Antibiotics:  levaquin  HPI/Subjective: Patient with heavy accent Eating breakfast No SOB, no CP  Objective: Filed Vitals:   06/16/13 0808  BP: 107/57  Pulse: 70  Temp:   Resp:     Intake/Output Summary (Last 24 hours) at 06/16/13 0817 Last data filed at 06/15/13 2313  Gross per 24 hour  Intake      0 ml  Output    200 ml  Net   -200 ml   Filed Weights   06/15/13 2029 06/16/13 0500  Weight: 87.181 kg (192 lb 3.2 oz) 86.4 kg (190 lb 7.6 oz)    Exam:   General:  pleasant  Cardiovascular: rrr  Respiratory: no wheezing  Abdomen: +BS, soft  Musculoskeletal: moves all 4 ext   Data Reviewed: Basic Metabolic Panel:  Recent Labs Lab 06/15/13 1615 06/15/13 2150 06/16/13 0358  NA 145  --  145  K 4.5  --  4.0  CL 104  --  102  CO2 32  --  31  GLUCOSE 82  --  92  BUN 39*  --  40*  CREATININE 1.16*  1.18* 1.10  CALCIUM 9.2  --  9.5   Liver Function Tests:  Recent Labs Lab 06/15/13 1615  AST 26  ALT 32  ALKPHOS 59  BILITOT <0.2*  PROT 6.4  ALBUMIN 3.0*   No results found for this basename: LIPASE, AMYLASE,  in the last 168 hours No results found for this basename: AMMONIA,  in the last 168 hours CBC:  Recent Labs Lab 06/15/13 1615 06/15/13 2150 06/16/13 0358  WBC 4.9 4.1 4.0  NEUTROABS 3.2  --   --   HGB 8.5* 8.6* 8.7*  HCT 26.1* 27.0* 27.2*  MCV 94.2 94.4 95.1  PLT 112* 101* 118*   Cardiac Enzymes: No results found for this basename: CKTOTAL, CKMB, CKMBINDEX, TROPONINI,  in the last 168 hours BNP (last 3 results)  Recent Labs  06/15/13 1615  PROBNP 458.6*   CBG: No results found for this basename: GLUCAP,  in the last 168 hours  No results found for this or any previous visit (from the past 240 hour(s)).   Studies: Dg Chest 2 View  06/15/2013   CLINICAL DATA:  Leg swelling, cough.  EXAM: CHEST  2 VIEW  COMPARISON:  08/26/2007  FINDINGS: Cardiomegaly. Vascular congestion. No overt edema. No confluent opacities. Suspect trace effusions posteriorly on the lateral view. No acute bony abnormality.  IMPRESSION:  Cardiomegaly with vascular congestion and trace bilateral effusions.   Electronically Signed   By: Rolm Baptise M.D.   On: 06/15/2013 17:37   Ct Head Wo Contrast  06/16/2013   CLINICAL DATA:  Confusion.  EXAM: CT HEAD WITHOUT CONTRAST  TECHNIQUE: Contiguous axial images were obtained from the base of the skull through the vertex without intravenous contrast.  COMPARISON:  11/16/2007  FINDINGS: Right occipital encephalomalacia is again seen, consistent with remote infarct. Mild age-related cerebral atrophy is present. There is no evidence of acute cortical infarct, mass, midline shift, intracranial hemorrhage, or extra-axial fluid collection. Prior bilateral cataract surgery is noted. Mastoid air cells and visualized paranasal sinuses are clear.  IMPRESSION: No  evidence of acute intracranial abnormality.   Electronically Signed   By: Logan Bores   On: 06/16/2013 00:48    Scheduled Meds: . atorvastatin  20 mg Oral q1800  . donepezil  10 mg Oral QHS  . ferrous sulfate  300 mg Oral BID WC  . furosemide  20 mg Intravenous Q12H  . heparin  5,000 Units Subcutaneous Q8H  . levofloxacin (LEVAQUIN) IV  500 mg Intravenous Q48H  . levothyroxine  137 mcg Oral QAC breakfast  . potassium chloride  10 mEq Oral Q7 days  . sodium chloride  3 mL Intravenous Q12H   Continuous Infusions:   Active Problems:   CHF (congestive heart failure)   HTN (hypertension)   Weakness   Confusion   Hyperlipemia   Hypothyroid   URI (upper respiratory infection)   Anemia    Time spent: 35 min    Aspen Lawrance  [facility] Pager [phone]. If 7PM-7AM, please contact night-coverage at [email or web address], password [facility] 06/16/2013, 8:17 AM  LOS: 1 day

## 2013-06-17 DIAGNOSIS — I5033 Acute on chronic diastolic (congestive) heart failure: Principal | ICD-10-CM

## 2013-06-17 DIAGNOSIS — R4182 Altered mental status, unspecified: Secondary | ICD-10-CM

## 2013-06-17 LAB — RESPIRATORY VIRUS PANEL
ADENOVIRUS: NOT DETECTED
INFLUENZA A H1: NOT DETECTED
INFLUENZA B 1: NOT DETECTED
Influenza A H3: NOT DETECTED
Influenza A: DETECTED — AB
Metapneumovirus: NOT DETECTED
PARAINFLUENZA 2 A: NOT DETECTED
PARAINFLUENZA 3 A: NOT DETECTED
Parainfluenza 1: NOT DETECTED
RESPIRATORY SYNCYTIAL VIRUS B: NOT DETECTED
Respiratory Syncytial Virus A: NOT DETECTED
Rhinovirus: NOT DETECTED

## 2013-06-17 MED ORDER — LEVOFLOXACIN 500 MG PO TABS
500.0000 mg | ORAL_TABLET | ORAL | Status: DC
  Administered 2013-06-17: 500 mg via ORAL
  Filled 2013-06-17: qty 1

## 2013-06-17 MED ORDER — FUROSEMIDE 40 MG PO TABS
40.0000 mg | ORAL_TABLET | Freq: Every day | ORAL | Status: DC
  Administered 2013-06-17: 40 mg via ORAL
  Filled 2013-06-17 (×2): qty 1

## 2013-06-17 NOTE — Care Management Note (Addendum)
  Page 2 of 2   06/21/2013     10:38:11 AM   CARE MANAGEMENT NOTE 06/21/2013  Patient:  KAMILE, FASSLER   Account Number:  [id number]  Date Initiated:  06/17/2013  Documentation initiated by:  Shafin Pollio  Subjective/Objective Assessment:   Admitted with CHF     Action/Plan:   Monitor for disposition needs   Anticipated DC Date:  06/18/2013   Anticipated DC Plan:  [place]  In-house referral  Clinical Social Worker         Choice offered to / List presented to:             Status of service:  Completed, signed off Medicare Important Message given?   (If response is "NO", the following Medicare IM given date fields will be blank) Date Medicare IM given:   Date Additional Medicare IM given:    Discharge Disposition:    Per UR Regulation:  Reviewed for med. necessity/level of care/duration of stay  If discussed at [place] of Stay Meetings, dates discussed:    Comments:  2/13/2015l [facility] screenings complete and patient does not meet LTAC at this time. (SW notified) Disposition:  SNF D/C Barrier:   SNF will not take d/t NG tube at this time. Trishelle Devora RN, BSN, MSHL, CCM   06/19/2013 NPO, NG tube  and IV Lasix Dispositon Plan:  SNF Solash Tullo RN, BSN, MSHL, CCM   06/17/2013 Social:  From home; w/c dependent. Dr. Dyann Kief recommending SNF at d/c.  (SW Notified). ADD: +1

## 2013-06-17 NOTE — Progress Notes (Signed)
Physical Therapy Treatment Patient Details Name: Alexandra Lucas MRN: [id number] DOB: [birth year] Today's Date: 06/17/2013 Time: 0623-7628 PT Time Calculation (min): 26 min  PT Assessment / Plan / Recommendation  History of Present Illness Patient is a 80 yo female admitted with CHF, anemia, confusion, weakness.  Patient with h/o mild dementia.   PT Comments   Pt with improved sitting balance today with posterior lean.  Pt able to sit with MIN/guard while performing UE tasks of crossing midline.  Will need +2 for transfers.  Follow Up Recommendations  SNF;Supervision/Assistance - 24 hour     Does the patient have the potential to tolerate intense rehabilitation     Barriers to Discharge        Equipment Recommendations       Recommendations for Other Services    Frequency Min 3X/week   Progress towards PT Goals Progress towards PT goals: Progressing toward goals  Plan Current plan remains appropriate    Precautions / Restrictions Precautions Precautions: Fall Precaution Comments: Had fall at home Restrictions Weight Bearing Restrictions: No   Pertinent Vitals/Pain No c/o pain    Mobility  Bed Mobility Overal bed mobility: Needs Assistance Bed Mobility: Supine to Sit;Sit to Supine Supine to sit: Max assist;HOB elevated Sit to supine: Total assist General bed mobility comments: Tactile cueing to use rail for A.  Pt following commands to move legs to EOB.  Pt attempting to scoot towards HOB and placing hands in correct place, but unable to push self up.  MAX A with scooting and once supine +2 with nurse tech to get pt up in bed.    Exercises General Exercises - Lower Extremity Ankle Circles/Pumps: AAROM;Both;Supine;5 reps Straight Leg Raises: Strengthening;Both;5 reps;Supine (Pt lifting leg with knee flexed due to decreased extension)   PT Diagnosis:    PT Problem List:   PT Treatment Interventions:     PT Goals (current goals can now be found in the care plan  section) Acute Rehab PT Goals Time For Goal Achievement: 06/30/13 Potential to Achieve Goals: Fair  Visit Information  Last PT Received On: 06/17/13 Assistance Needed: +2 History of Present Illness: Patient is a 80 yo female admitted with CHF, anemia, confusion, weakness.  Patient with h/o mild dementia.    Subjective Data  Subjective: Pt supine in bed upon arrival with no family present.   Cognition  Cognition Arousal/Alertness: Awake/alert Behavior During Therapy: WFL for tasks assessed/performed Overall Cognitive Status: No family/caregiver present to determine baseline cognitive functioning Area of Impairment: Orientation Orientation Level: Disoriented to;Time;Situation Memory: Decreased short-term memory Following Commands: Follows one step commands inconsistently;Follows one step commands with increased time Safety/Judgement: Decreased awareness of safety General Comments: Pt more interactive today. Smiling and attempting conversation.    Balance  Balance Sitting-balance support: Bilateral upper extremity supported;Feet supported Sitting balance-Leahy Scale: Fair Sitting balance - Comments: Sat EOB and worked on centering self. Pt tends drift posteriorly as she sits.  Crossing midline  with UE, trunk stability work.  Pt with slow processing, but able to fllow most 1 step commands.  Improved balance from yesterday but needed MIN/guard. Postural control: Posterior lean  End of Session PT - End of Session Equipment Utilized During Treatment: Gait belt Activity Tolerance: Patient tolerated treatment well Patient left: in bed;with call bell/phone within reach   GP     [place] 06/17/2013, 11:26 AM

## 2013-06-17 NOTE — Progress Notes (Signed)
PROGRESS NOTE  Alexandra Lucas EPP:[id number] DOB: Apr 08, 1918 DOA: 06/15/2013 PCP: Maggie Font, MD  Assessment/Plan: Acute on chronic diastolic congestive heart failure:  -continue low sodium diet, strict I's/O's and daily weights -BNP and CXR to be repeated tomorrow morning -Lasix changed to 40mg  daily -2-D echo with preserved EF and demonstrating grade 1 diastolic dysfunction  Anemia: due to anemia of chronic disease -continue monitoring hemoglobin -Hemoccult positive to do to mild hemorrhoidal bleed -Hgb stable. -continue ferrous sulfate  Weakness and physical deconditioning:  -most likely related to her congestive heart failure/anemia/presumed bronchitis  -PT eval recommending SNF -social work contacted  Confusion/dementia/encephalopathy (toxic vs hypoxic event vs meds):  -d/c lyrica -klonopin dose decreased -bronchitis infection and decrease O2 due to CHF exacerbation also playing role in her AMS. -per daughter patient with baseline mild dementia -continue supportive care and donepezil  Hyperlipidemia. Continue with statin    Hypertension. Soft but stable. -will slowly resume home BP meds as needed -continue PO lasix for now  Hypothyroidism. Continue with Synthroid   URI due to bronchitis: will continue levaquin; plan is to treat for a total of 7 days. -continue PRN nebs -breathing is better   Code Status: full Family Communication: patient and daughter at bedside Disposition Plan:  Needs SNF at discharge    Consultants:  None  Procedures:  Echo:  - Left ventricle: The cavity size was normal. Systolic function was normal. The estimated ejection fraction was in the range of 55% to 60%. Wall motion was normal; there were no regional wall motion abnormalities. Doppler parameters are consistent with abnormal left ventricular relaxation (grade 1 diastolic dysfunction). - Aortic valve: Mild regurgitation. - Mitral valve: Calcified annulus. - Left  atrium: The atrium was mildly dilated. - Pulmonary arteries: PA peak pressure: 15mm Hg (S).   Antibiotics:  levaquin  HPI/Subjective: Patient AAOX2, no fever, no major complaints overnight. Feeling weak. Improved breathing and no CP.  Objective: Filed Vitals:   06/17/13 0511  BP: 93/52  Pulse: 76  Temp: 96.7 F (35.9 C)  Resp: 17    Intake/Output Summary (Last 24 hours) at 06/17/13 1131 Last data filed at 06/17/13 0956  Gross per 24 hour  Intake    243 ml  Output    200 ml  Net     43 ml   Filed Weights   06/15/13 2029 06/16/13 0500 06/17/13 0511  Weight: 87.181 kg (192 lb 3.2 oz) 86.4 kg (190 lb 7.6 oz) 84.5 kg (186 lb 4.6 oz)    Exam:   General:  Pleasantly confused, no agitation, AAOX2  Cardiovascular: regular rate, no rubs or gallops, no JVD  Respiratory: no wheezing, slight decreased BS at bases  Abdomen: +Bowel sounds, no distension, no tenderness  Musculoskeletal: moves all 4 ext, trace edema bilaterally  Data Reviewed: Basic Metabolic Panel:  Recent Labs Lab 06/15/13 1615 06/15/13 2150 06/16/13 0358  NA 145  --  145  K 4.5  --  4.0  CL 104  --  102  CO2 32  --  31  GLUCOSE 82  --  92  BUN 39*  --  40*  CREATININE 1.16* 1.18* 1.10  CALCIUM 9.2  --  9.5   Liver Function Tests:  Recent Labs Lab 06/15/13 1615  AST 26  ALT 32  ALKPHOS 59  BILITOT <0.2*  PROT 6.4  ALBUMIN 3.0*   CBC:  Recent Labs Lab 06/15/13 1615 06/15/13 2150 06/16/13 0358 06/16/13 1435  WBC 4.9 4.1 4.0  --  NEUTROABS 3.2  --   --   --   HGB 8.5* 8.6* 8.7* 8.4*  HCT 26.1* 27.0* 27.2* 26.0*  MCV 94.2 94.4 95.1  --   PLT 112* 101* 118*  --    BNP (last 3 results)  Recent Labs  06/15/13 1615  PROBNP 458.6*   CBG: No results found for this basename: GLUCAP,  in the last 168 hours  Recent Results (from the past 240 hour(s))  URINE CULTURE     Status: None   Collection Time    06/15/13  5:01 PM      Result Value Range Status   Specimen Description  URINE, CLEAN CATCH   Final   Special Requests NONE   Final   Culture  Setup Time     Final   Value: 06/15/2013 22:15     Performed at [facility]     Final   Value: 50,000 COLONIES/ML     Performed at [facility]   Culture     Final   Value: Multiple bacterial morphotypes present, none predominant. Suggest appropriate recollection if clinically indicated.     Performed at [facility]   Report Status 06/16/2013 FINAL   Final     Studies: Dg Chest 2 View  06/15/2013   CLINICAL DATA:  Leg swelling, cough.  EXAM: CHEST  2 VIEW  COMPARISON:  08/26/2007  FINDINGS: Cardiomegaly. Vascular congestion. No overt edema. No confluent opacities. Suspect trace effusions posteriorly on the lateral view. No acute bony abnormality.  IMPRESSION: Cardiomegaly with vascular congestion and trace bilateral effusions.   Electronically Signed   By: Rolm Baptise M.D.   On: 06/15/2013 17:37   Ct Head Wo Contrast  06/16/2013   CLINICAL DATA:  Confusion.  EXAM: CT HEAD WITHOUT CONTRAST  TECHNIQUE: Contiguous axial images were obtained from the base of the skull through the vertex without intravenous contrast.  COMPARISON:  11/16/2007  FINDINGS: Right occipital encephalomalacia is again seen, consistent with remote infarct. Mild age-related cerebral atrophy is present. There is no evidence of acute cortical infarct, mass, midline shift, intracranial hemorrhage, or extra-axial fluid collection. Prior bilateral cataract surgery is noted. Mastoid air cells and visualized paranasal sinuses are clear.  IMPRESSION: No evidence of acute intracranial abnormality.   Electronically Signed   By: Logan Bores   On: 06/16/2013 00:48    Scheduled Meds: . atorvastatin  20 mg Oral q1800  . donepezil  10 mg Oral QHS  . ferrous sulfate  300 mg Oral BID WC  . furosemide  40 mg Oral Daily  . heparin  5,000 Units Subcutaneous Q8H  . hydrocortisone   Rectal BID  . levofloxacin  500 mg Oral Q48H  .  levothyroxine  137 mcg Oral QAC breakfast  . potassium chloride  10 mEq Oral Q7 days  . sodium chloride  3 mL Intravenous Q12H   Time spent: 35 min   Joseth Weigel  [facility] Pager [phone]. If 7PM-7AM, please contact night-coverage at [email or web address], password [facility] 06/17/2013, 11:31 AM  LOS: 2 days

## 2013-06-18 ENCOUNTER — Inpatient Hospital Stay (HOSPITAL_COMMUNITY): Payer: Medicare Other

## 2013-06-18 DIAGNOSIS — R5383 Other fatigue: Secondary | ICD-10-CM

## 2013-06-18 DIAGNOSIS — R5381 Other malaise: Secondary | ICD-10-CM

## 2013-06-18 DIAGNOSIS — I10 Essential (primary) hypertension: Secondary | ICD-10-CM

## 2013-06-18 DIAGNOSIS — I5033 Acute on chronic diastolic (congestive) heart failure: Secondary | ICD-10-CM

## 2013-06-18 DIAGNOSIS — J069 Acute upper respiratory infection, unspecified: Secondary | ICD-10-CM

## 2013-06-18 DIAGNOSIS — E039 Hypothyroidism, unspecified: Secondary | ICD-10-CM

## 2013-06-18 DIAGNOSIS — R4182 Altered mental status, unspecified: Secondary | ICD-10-CM

## 2013-06-18 LAB — BASIC METABOLIC PANEL
BUN: 34 mg/dL — AB (ref 6–23)
BUN: 32 mg/dL — ABNORMAL HIGH (ref 6–23)
CHLORIDE: 104 meq/L (ref 96–112)
CHLORIDE: 106 meq/L (ref 96–112)
CO2: 29 meq/L (ref 19–32)
CO2: 34 meq/L — AB (ref 19–32)
Calcium: 9.4 mg/dL (ref 8.4–10.5)
Calcium: 9.4 mg/dL (ref 8.4–10.5)
Creatinine, Ser: 1.21 mg/dL — ABNORMAL HIGH (ref 0.50–1.10)
Creatinine, Ser: 1.27 mg/dL — ABNORMAL HIGH (ref 0.50–1.10)
GFR calc Af Amer: 43 mL/min — ABNORMAL LOW (ref >90)
GFR calc Af Amer: 41 mL/min — ABNORMAL LOW (ref >90)
GFR calc non Af Amer: 35 mL/min — ABNORMAL LOW (ref >90)
GFR, EST NON AFRICAN AMERICAN: 37 mL/min — AB (ref >90)
GLUCOSE: 62 mg/dL — AB (ref 70–99)
GLUCOSE: 96 mg/dL (ref 70–99)
POTASSIUM: 4.4 meq/L (ref 3.7–5.3)
Potassium: 3.7 mEq/L (ref 3.7–5.3)
SODIUM: 147 meq/L (ref 137–147)
SODIUM: 149 meq/L — AB (ref 137–147)

## 2013-06-18 LAB — CBC
HEMATOCRIT: 31.3 % — AB (ref 36.0–46.0)
HEMOGLOBIN: 10.2 g/dL — AB (ref 12.0–15.0)
MCH: 29.9 pg (ref 26.0–34.0)
MCHC: 32.6 g/dL (ref 30.0–36.0)
MCV: 91.8 fL (ref 78.0–100.0)
Platelets: 75 10*3/uL — ABNORMAL LOW (ref 150–400)
RBC: 3.41 MIL/uL — AB (ref 3.87–5.11)
RDW: 16.7 % — ABNORMAL HIGH (ref 11.5–15.5)
WBC: 6.5 10*3/uL (ref 4.0–10.5)

## 2013-06-18 LAB — PRO B NATRIURETIC PEPTIDE: PRO B NATRI PEPTIDE: 1139 pg/mL — AB (ref 0–450)

## 2013-06-18 MED ORDER — LEVOFLOXACIN 500 MG PO TABS: 500.0000 mg | ORAL_TABLET | ORAL | Status: DC

## 2013-06-18 MED ORDER — DEXTROSE-NACL 5-0.45 % IV SOLN
INTRAVENOUS | Status: DC
  Administered 2013-06-18 – 2013-06-20 (×3): via INTRAVENOUS
  Administered 2013-06-21 (×2): 10 mL/h via INTRAVENOUS

## 2013-06-18 MED ORDER — FUROSEMIDE 10 MG/ML IJ SOLN
INTRAMUSCULAR | Status: AC
  Filled 2013-06-18: qty 4

## 2013-06-18 MED ORDER — FUROSEMIDE 10 MG/ML IJ SOLN: 40.0000 mg | Freq: Once | INTRAMUSCULAR | Status: DC

## 2013-06-18 MED ORDER — FUROSEMIDE 10 MG/ML IJ SOLN
20.0000 mg | Freq: Once | INTRAMUSCULAR | Status: AC
  Administered 2013-06-18: 20 mg via INTRAVENOUS

## 2013-06-18 MED ORDER — FUROSEMIDE 10 MG/ML IJ SOLN: 40.0000 mg | Freq: Two times a day (BID) | INTRAMUSCULAR | Status: DC

## 2013-06-18 MED ORDER — FUROSEMIDE 20 MG PO TABS
20.0000 mg | ORAL_TABLET | Freq: Every day | ORAL | Status: DC
  Filled 2013-06-18: qty 1

## 2013-06-18 MED ORDER — OSELTAMIVIR PHOSPHATE 75 MG PO CAPS
75.0000 mg | ORAL_CAPSULE | Freq: Two times a day (BID) | ORAL | Status: DC
  Filled 2013-06-18 (×2): qty 1

## 2013-06-18 MED ORDER — VALSARTAN 160 MG PO TABS: 320.0000 mg | ORAL_TABLET | Freq: Every day | ORAL | Status: DC

## 2013-06-18 MED ORDER — OSELTAMIVIR PHOSPHATE 75 MG PO CAPS: 75.0000 mg | ORAL_CAPSULE | Freq: Two times a day (BID) | ORAL | Status: DC

## 2013-06-18 MED ORDER — OSELTAMIVIR PHOSPHATE 30 MG PO CAPS
30.0000 mg | ORAL_CAPSULE | Freq: Two times a day (BID) | ORAL | Status: DC
  Administered 2013-06-18 – 2013-06-21 (×6): 30 mg via ORAL
  Filled 2013-06-18 (×8): qty 1

## 2013-06-18 NOTE — Progress Notes (Addendum)
PROGRESS NOTE  Alexandra Lucas VQM:[id number] DOB: 03/04/1918 DOA: 06/15/2013 PCP: Maggie Font, MD  Assessment/Plan: Acute on chronic diastolic congestive heart failure:  -continue low sodium diet, strict I's/O's and daily weights -BNP and CXR to be repeated tomorrow morning - Cont lasix oral, give on extra dose of lasix IV, had mild JVD, CXR shows vascular pulmonary congestion. -2-D echo with preserved EF and demonstrating grade 1 diastolic dysfunction  Anemia: due to anemia of chronic disease -continue monitoring hemoglobin -Hemoccult positive to do to mild hemorrhoidal bleed -Hgb stable. -continue ferrous sulfate  Acute confusional state: - responding to painful stimuli. - no medications given. - check MRI of head.  Weakness and physical deconditioning:  -most likely related to her congestive heart failure/anemia/presumed bronchitis  -PT eval recommending SNF -social work contacted  Confusion/dementia/encephalopathy (toxic vs hypoxic event vs meds):  -d/c lyrica -klonopin dose decreased -bronchitis infection and decrease O2 due to CHF exacerbation also playing role in her AMS. -per daughter patient with baseline mild dementia -continue supportive care and donepezil. - use haldol PRN.  Hyperlipidemia. Continue with statin    Hypertension.  - Stable. - Resume home BP meds as needed  Hypothyroidism. Continue with Synthroid   URI due to bronchitis:  - will continue levaquin; plan is to treat for a total of 7 days. - continue PRN nebs - breathing is better   Code Status: full Family Communication: patient and daughter at bedside Disposition Plan:  Needs SNF at discharge    Consultants:  None  Procedures:  Echo:  - Left ventricle: The cavity size was normal. Systolic function was normal. The estimated ejection fraction was in the range of 55% to 60%. Wall motion was normal; there were no regional wall motion abnormalities. Doppler parameters are  consistent with abnormal left ventricular relaxation (grade 1 diastolic dysfunction). - Aortic valve: Mild regurgitation. - Mitral valve: Calcified annulus. - Left atrium: The atrium was mildly dilated. - Pulmonary arteries: PA peak pressure: 89m Hg (S).   Antibiotics:  levaquin  HPI/Subjective: Patient AAOX2, no fever, no major complaints overnight. Feeling weak. Improved breathing and no CP.  Objective: Filed Vitals:   06/18/13 0438  BP: 114/56  Pulse:   Temp: 97 F (36.1 C)  Resp: 22    Intake/Output Summary (Last 24 hours) at 06/18/13 0827 Last data filed at 06/18/13 09326 Gross per 24 hour  Intake    603 ml  Output      0 ml  Net    603 ml   Filed Weights   06/16/13 0500 06/17/13 0511 06/18/13 0438  Weight: 86.4 kg (190 lb 7.6 oz) 84.5 kg (186 lb 4.6 oz) 83.3 kg (183 lb 10.3 oz)    Exam:   General:  Pleasantly confused, no agitation, AAOX2  Cardiovascular: regular rate, no rubs or gallops, +  JVD  Respiratory: no wheezing, clear to auscultation, ronchi B/L  Abdomen: +Bowel sounds, no distension, no tenderness  Musculoskeletal: moves all 4 ext, trace edema bilaterally  Data Reviewed: Basic Metabolic Panel:  Recent Labs Lab 06/15/13 1615 06/15/13 2150 06/16/13 0358 06/18/13 0300  NA 145  --  145 147  K 4.5  --  4.0 4.4  CL 104  --  102 104  CO2 32  --  31 29  GLUCOSE 82  --  92 62*  BUN 39*  --  40* 34*  CREATININE 1.16* 1.18* 1.10 1.21*  CALCIUM 9.2  --  9.5 9.4   Liver Function Tests:  Recent Labs Lab 06/15/13 1615  AST 26  ALT 32  ALKPHOS 59  BILITOT <0.2*  PROT 6.4  ALBUMIN 3.0*   CBC:  Recent Labs Lab 06/15/13 1615 06/15/13 2150 06/16/13 0358 06/16/13 1435 06/18/13 0300  WBC 4.9 4.1 4.0  --  6.5  NEUTROABS 3.2  --   --   --   --   HGB 8.5* 8.6* 8.7* 8.4* 10.2*  HCT 26.1* 27.0* 27.2* 26.0* 31.3*  MCV 94.2 94.4 95.1  --  91.8  PLT 112* 101* 118*  --  75*   BNP (last 3 results)  Recent Labs  06/15/13 1615  06/18/13 0300  PROBNP 458.6* 1139.0*   CBG: No results found for this basename: GLUCAP,  in the last 168 hours  Recent Results (from the past 240 hour(s))  URINE CULTURE     Status: None   Collection Time    06/15/13  5:01 PM      Result Value Range Status   Specimen Description URINE, CLEAN CATCH   Final   Special Requests NONE   Final   Culture  Setup Time     Final   Value: 06/15/2013 22:15     Performed at [facility]     Final   Value: 50,000 COLONIES/ML     Performed at [facility]   Culture     Final   Value: Multiple bacterial morphotypes present, none predominant. Suggest appropriate recollection if clinically indicated.     Performed at [facility]   Report Status 06/16/2013 FINAL   Final  RESPIRATORY VIRUS PANEL     Status: Abnormal   Collection Time    06/15/13  5:07 PM      Result Value Range Status   Source - RVPAN NASAL SWAB   Corrected   Comment: CORRECTED ON 02/09 AT 1851: PREVIOUSLY REPORTED AS NASAL SWAB   Respiratory Syncytial Virus A NOT DETECTED   Final   Respiratory Syncytial Virus B NOT DETECTED   Final   Influenza A DETECTED (*)  Final   Influenza B NOT DETECTED   Final   Parainfluenza 1 NOT DETECTED   Final   Parainfluenza 2 NOT DETECTED   Final   Parainfluenza 3 NOT DETECTED   Final   Metapneumovirus NOT DETECTED   Final   Rhinovirus NOT DETECTED   Final   Adenovirus NOT DETECTED   Final   Influenza A H1 NOT DETECTED   Final   Influenza A H3 NOT DETECTED   Final   Comment: (NOTE)           Normal Reference Range for each Analyte: NOT DETECTED     Testing performed using the Luminex xTAG Respiratory Viral Panel test     kit.     This test was developed and its performance characteristics determined     by [facility]. It has not been cleared or approved by the [place]     Food and Drug Administration. This test is used for clinical purposes.     It should not be regarded as investigational or for  research. This     laboratory is certified under the [place] (CLIA) as qualified to perform high complexity     clinical laboratory testing.     Performed at [facility]     Studies: Dg Chest 2 View  06/18/2013   CLINICAL DATA:  Shortness  of Breath  EXAM: CHEST  2 VIEW  COMPARISON:  June 15, 2013  FINDINGS: There is cardiomegaly with mild pulmonary venous hypertension, stable. There is mild atelectatic change in the left base. There is no frank edema or consolidation. No adenopathy. There is degenerative change in the thoracic spine.  IMPRESSION: Findings suggesting a degree of volume overload but no frank edema or consolidation. There is mild stable atelectasis in the left base. No pneumothorax.   Electronically Signed   By: Lowella Grip M.D.   On: 06/18/2013 07:46    Scheduled Meds: . atorvastatin  20 mg Oral q1800  . donepezil  10 mg Oral QHS  . ferrous sulfate  300 mg Oral BID WC  . furosemide  40 mg Oral Daily  . heparin  5,000 Units Subcutaneous Q8H  . hydrocortisone   Rectal BID  . levofloxacin  500 mg Oral Q48H  . levothyroxine  137 mcg Oral QAC breakfast  . potassium chloride  10 mEq Oral Q7 days  . sodium chloride  3 mL Intravenous Q12H   Time spent: 35 min   FELIZ Marguarite Arbour  [facility] Pager [phone]. If 7PM-7AM, please contact night-coverage at [email or web address], password [facility] 06/18/2013, 8:27 AM  LOS: 3 days

## 2013-06-18 NOTE — Progress Notes (Signed)
2 RN's at beside attempted to insert G Tube as ordered by MD.  Unsuccessful attempts x 3.   Gae Gallop RN

## 2013-06-18 NOTE — Progress Notes (Signed)
Pt remains lethargic.  Pt not following commands and occasionally mumbles.  BP 123/57, HR 82, 20 R, 96% on RA, and afebrile.  Daughter expresses increasing concern.  MD notified.  MD at bed to further assess. Gae Gallop RN

## 2013-06-18 NOTE — Discharge Summary (Addendum)
-   Pt confused unable to swallow. - MRI still pending. - drop NG tube and start Tamiflu per tube cont NPO.

## 2013-06-18 NOTE — Clinical Social Work Placement (Addendum)
     Clinical Social Work Department CLINICAL SOCIAL WORK PLACEMENT NOTE 06/24/2013  Patient:  Alexandra Lucas, Alexandra Lucas  Account Number:  [id number] Admit date:  06/15/2013  Clinical Social Worker:  Butch Penny Aveline Daus, LCSWA  Date/time:  06/18/2013 01:45 PM  Clinical Social Work is seeking post-discharge placement for this patient at the following level of care:   SKILLED NURSING   (*CSW will update this form in Epic as items are completed)   06/18/2013  Patient/family provided with [place] Department of Clinical Social Works list of facilities offering this level of care within the geographic area requested by the patient (or if unable, by the patients family).  06/18/2013  Patient/family informed of their freedom to choose among providers that offer the needed level of care, that participate in Medicare, Medicaid or managed care program needed by the patient, have an available bed and are willing to accept the patient.  06/18/2013  Patient/family informed of [facility] ownership interest in [facility], as well as of the fact that they are under no obligation to receive care at this facility.  PASARR submitted to EDS on 06/18/2013 PASARR number received from EDS on 06/18/2013  FL2 transmitted to all facilities in geographic area requested by pt/family on  06/18/2013 FL2 transmitted to all facilities within larger geographic area on   Patient informed that his/her managed care company has contracts with or will negotiate with  certain facilities, including the following:     Patient/family informed of bed offers received:  06/22/2013 Patient chooses bed at  Physician recommends and patient chooses bed at    Patient to be transferred to  on  06/22/2013 Patient to be transferred to facility by Ambulance  Corey Harold)  The following physician request were entered in Epic:   Additional Comments: DC 06/22/13  (Sat) to [place] by weekend CSW coverage- [place].

## 2013-06-18 NOTE — Clinical Social Work Psychosocial (Signed)
     Clinical Social Work Department BRIEF PSYCHOSOCIAL ASSESSMENT 06/18/2013  Patient:  Alexandra Lucas, Alexandra Lucas     Account Number:  [id number]     Admit date:  06/15/2013  Clinical Social Worker:  Iona Coach  Date/Time:  06/18/2013 02:00 PM  Referred by:  Physician  Date Referred:  06/18/2013 Referred for  SNF Placement   Other Referral:   Interview type:  Family Other interview type:   Daughter  Mora Bellman    PSYCHOSOCIAL DATA Living Status:  WITH ADULT CHILDREN Admitted from facility:   Level of care:   Primary support name:  Joya Salm  (c)  [phone] Primary support relationship to patient:  CHILD, ADULT Degree of support available:   Strong support    CURRENT CONCERNS Current Concerns  Post-Acute Placement  Other - See comment   Other Concerns:   * Patient has become poorly responsive- daughter said this is not baseline    SOCIAL WORK ASSESSMENT / PLAN 80 year old female referred to [facility] for short term SNF per PT recommendation.  Patient current lives with her daughter who states that prior to hospitalization- patient was very active and able to many things for herself.  Patient was admitted with CHF and tested postive for the flu.  Patient is currently not responding well; would not awaken during CSW's visit;  daughter is extremely upset about this and is afraid something has happened to her mother.  Dr. Venetia Constable has spoken to her and is ordering and MRI and other tests in attempt to determine why she is not awake and alert.  CSW provided support and discussed SNF bed search process and daughter agreed to bed search. She is extremely concerned that her mother is "too sick" to leave the hospital at this time.  Fl2 placed on chart for MDs signature.   Assessment/plan status:  Psychosocial Support/Ongoing Assessment of Needs Other assessment/ plan:   Information/referral to community resources:   SNF bed list provided to daughter for review. She states that  while she is not familiar with any of the SNF's - she is interested in [facility] as it is close to the hospital.    PATIENTS/FAMILYS RESPONSE TO PLAN OF CARE: Patient was in a deep sleep during the entire visit and did not respond to sound or touch. Her daughter was noted to be extremely tearful and quite anxious at beginng of the visit and stated that she is upset that this is not baseline for her mother and she is afraid she has had a stroke.  "My mom feeds herself, is alert, oriented and was able to get around with a walker- something's wrong and no one will listen."  Prior to CSW leaving the room she was calm and more relaxed; she stated that Dr. Venetia Constable did talk to her prior to our visit and and provided reassurance and indicated that he would be working to determine reasons for patient's unresponsiveness.  Daughter was noted to be calm and smiling at end of the interview and stated that she was thankful for CSW's assistance.

## 2013-06-18 NOTE — Progress Notes (Signed)
MRI has been completed.  Results now available.  MD has been made aware. Gae Gallop RN

## 2013-06-19 DIAGNOSIS — J111 Influenza due to unidentified influenza virus with other respiratory manifestations: Secondary | ICD-10-CM

## 2013-06-19 LAB — BASIC METABOLIC PANEL WITH GFR
BUN: 27 mg/dL — ABNORMAL HIGH (ref 6–23)
CO2: 29 meq/L (ref 19–32)
Calcium: 9.5 mg/dL (ref 8.4–10.5)
Chloride: 103 meq/L (ref 96–112)
Creatinine, Ser: 1.35 mg/dL — ABNORMAL HIGH (ref 0.50–1.10)
GFR calc Af Amer: 38 mL/min — ABNORMAL LOW
GFR calc non Af Amer: 33 mL/min — ABNORMAL LOW
Glucose, Bld: 100 mg/dL — ABNORMAL HIGH (ref 70–99)
Potassium: 4 meq/L (ref 3.7–5.3)
Sodium: 146 meq/L (ref 137–147)

## 2013-06-19 LAB — GLUCOSE, CAPILLARY
Glucose-Capillary: 135 mg/dL — ABNORMAL HIGH (ref 70–99)
Glucose-Capillary: 123 mg/dL — ABNORMAL HIGH (ref 70–99)

## 2013-06-19 MED ORDER — PRO-STAT SUGAR FREE PO LIQD
30.0000 mL | ORAL | Status: DC
Start: 2013-06-19 — End: 2013-06-21
  Administered 2013-06-19 – 2013-06-20 (×2): 30 mL
  Filled 2013-06-19 (×3): qty 30

## 2013-06-19 MED ORDER — JEVITY 1.2 CAL PO LIQD
1000.0000 mL | ORAL | Status: DC
  Administered 2013-06-19: 15:00:00
  Filled 2013-06-19 (×4): qty 1000

## 2013-06-19 NOTE — Progress Notes (Signed)
INITIAL NUTRITION ASSESSMENT  DOCUMENTATION CODES Per approved criteria  -Not Applicable   INTERVENTION: Initiate Jevity 1.2 @ 20 ml/hr via NGT and increase by 10 ml every 4 hours to goal rate of 55 ml/hr. 30 ml Prostat once daily.  At goal rate, tube feeding regimen will provide 1684 kcal, 88 grams of protein, and 1069 ml of H2O.  RD to continue to monitor nutrition care plan and goals of care   NUTRITION DIAGNOSIS: Inadequate oral intake related to inability to eat as evidenced by NPO status.   Goal: Pt to meet >/= 90% of their estimated nutrition needs  Monitor:  TF initiation/tolerance, I/O's, weight trends, labs, goals of care  Reason for Assessment: Consult for TF initiation and management  80 y.o. female  Admitting Dx: <principal problem not specified>  ASSESSMENT: 80 y.o. female, with history of mild dementia, wheelchair dependent, brought by her daughter for multiple complaints, including generalized weakness over the last week, with one fall during transfer from bed to wheelchair, cough for last 24 hours, generalized weakness and confusion, and shortness of breath, daughter reports she had upper respiratory infection recently, as well noticed in her mother having diarrhea x24 hours.  Per pt's daughter pt is comatose and unable to swallow. Pt has NGT in place. RD consulted for TF management. Pt's daughter states that pt usually weighs 203 lbs and pt has not been eating well the past few days. Per MD note, if over next 24-36 hours no improvement family will like to meet with PC and transition for comfort care will be made. Pt's weight on admission (2/7) was 192 lbs; pt has had 7% weight loss in the past 4 days.    Height: Ht Readings from Last 1 Encounters:  06/15/13 5\' 10"  (1.778 m)    Weight: Wt Readings from Last 1 Encounters:  06/19/13 178 lb 5.6 oz (80.9 kg)    Ideal Body Weight: 150 lbs  % Ideal Body Weight: 119%  Wt Readings from Last 10 Encounters:   06/19/13 178 lb 5.6 oz (80.9 kg)   Filed Weights   06/18/13 0438 06/19/13 0446 06/19/13 0657  Weight: 183 lb 10.3 oz (83.3 kg) 180 lb 12.4 oz (82 kg) 178 lb 5.6 oz (80.9 kg)    Usual Body Weight: 203 lbs  % Usual Body Weight: 88%  BMI:  Body mass index is 25.59 kg/(m^2).  Estimated Nutritional Needs: Kcal: 1575-1710 Protein: 90-100 grams Fluid: 1.5-1.7 L/day  Skin: +1 RLE and LLE edema; stage 2 pressure ulcer on ankle  Diet Order: NPO  EDUCATION NEEDS: -No education needs identified at this time   Intake/Output Summary (Last 24 hours) at 06/19/13 1204 Last data filed at 06/19/13 1132  Gross per 24 hour  Intake 828.33 ml  Output      0 ml  Net 828.33 ml    Last BM: 2/7  Labs:   Recent Labs Lab 06/18/13 0300 06/18/13 1005 06/19/13 0407  NA 147 149* 146  K 4.4 3.7 4.0  CL 104 106 103  CO2 29 34* 29  BUN 34* 32* 27*  CREATININE 1.21* 1.27* 1.35*  CALCIUM 9.4 9.4 9.5  GLUCOSE 62* 96 100*    CBG (last 3)  No results found for this basename: GLUCAP,  in the last 72 hours  Scheduled Meds: . atorvastatin  20 mg Oral q1800  . donepezil  10 mg Oral QHS  . ferrous sulfate  300 mg Oral BID WC  . heparin  5,000 Units Subcutaneous 3  times per day  . hydrocortisone   Rectal BID  . levothyroxine  137 mcg Oral QAC breakfast  . oseltamivir  30 mg Oral BID  . potassium chloride  10 mEq Oral Q7 days  . sodium chloride  3 mL Intravenous Q12H    Continuous Infusions: . dextrose 5 % and 0.45% NaCl 30 mL/hr at 06/19/13 1152    Past Medical History  Diagnosis Date  . Hypertension   . Stroke   . Thyroid disease   . Hyperlipidemia     Past Surgical History  Procedure Laterality Date  . Bladder suspension      Pryor Ochoa RD, LDN Inpatient Clinical Dietitian Pager: [phone] After Hours Pager: [phone]

## 2013-06-19 NOTE — Progress Notes (Addendum)
Physical Therapy Treatment Patient Details Name: BAYLE CALVO MRN: [id number] DOB: [birth year] Today's Date: 06/19/2013 Time: 8916-9450 PT Time Calculation (min): 14 min  PT Assessment / Plan / Recommendation  History of Present Illness Patient is a 80 yo female admitted with CHF, anemia, confusion, weakness.  Patient with h/o mild dementia.   PT Comments   Patient continues to be unsafe to discharge home due to decreased mobility and altered mental status compared to PLOF and will benefit from continued post-acute therapy. Currently recommending skilled nursing facility. Pt with decreased mobility compared to last PT treatment 2 days ago when pt was interactive, smiling and sitting EOB with 1 person A.  Pt was +2 Total A for rolling and supine <>sit.   Follow Up Recommendations  SNF;Supervision/Assistance - 24 hour     Does the patient have the potential to tolerate intense rehabilitation     Barriers to Discharge        Equipment Recommendations       Recommendations for Other Services    Frequency Min 3X/week   Progress towards PT Goals Progress towards PT goals: Not progressing toward goals - comment (Pt with decline compared to last treatment.)  Plan Current plan remains appropriate    Precautions / Restrictions Precautions Precautions: Fall Precaution Comments: Had fall at home Restrictions Weight Bearing Restrictions: No   Pertinent Vitals/Pain Unable to determine.  No grimacing.    Mobility  Bed Mobility Overal bed mobility: Needs Assistance;+2 for physical assistance Bed Mobility: Rolling;Supine to Sit Rolling: Total assist;+2 for physical assistance Supine to sit: Total assist;+2 for physical assistance Sit to supine: +2 for physical assistance;Total assist General bed mobility comments: Pt unable to achieve full sitting at EOB.  Pt opening eyes but not following commands.    Exercises     PT Diagnosis:    PT Problem List:   PT Treatment Interventions:      PT Goals (current goals can now be found in the care plan section)    Visit Information  Last PT Received On: 06/19/13 Assistance Needed: +2 History of Present Illness: Patient is a 80 yo female admitted with CHF, anemia, confusion, weakness.  Patient with h/o mild dementia.    Subjective Data  Subjective: Pt in bed with NG tube in place.   Cognition  Cognition Arousal/Alertness: Lethargic Overall Cognitive Status: Impaired/Different from baseline Area of Impairment: Orientation;Following commands;Awareness Orientation Level: Disoriented to;Person;Place;Time;Situation Following Commands:  (unable to follow commands today) Safety/Judgement: Decreased awareness of safety General Comments: Pt much more lethargic than from last treatment on 2/9 with new NG tube in place.    Balance  Balance Overall balance assessment: Needs assistance Sitting-balance support: Bilateral upper extremity supported Sitting balance-Leahy Scale: Zero Sitting balance - Comments: Pt unable to fully sit upright leaning R and grabbing at therapist. Postural control: Right lateral lean  End of Session PT - End of Session Activity Tolerance: Patient limited by lethargy Patient left: in bed;with call bell/phone within reach Nurse Communication: Mobility status   GP     Merlean Pizzini LUBECK 06/19/2013, 10:30 AM

## 2013-06-19 NOTE — Progress Notes (Signed)
UR completed Darrah Dredge K. Schylar Allard, RN, BSN, MSHL, CCM  06/19/2013 4:40 PM

## 2013-06-19 NOTE — Progress Notes (Signed)
SNF placement process is in place with current bed offers. Patient currently has an NGT which will not be accepted by a SNF.  CSW will continue to monitor and assist with placement. Lorie Phenix. [place], [place]

## 2013-06-19 NOTE — Progress Notes (Signed)
PROGRESS NOTE  Alexandra Lucas XMI:[id number] DOB: June 10, 1917 DOA: 06/15/2013 PCP: Maggie Font, MD  Assessment/Plan: Acute on chronic diastolic congestive heart failure:  -continue low sodium diet, strict I's/O's and daily weights -CXR on 2/10 demonstrating no edema or frank consolidations. Atelectasis in LLL still present. -Lasix IV currently, given NPO status -2-D echo with preserved EF and demonstrating grade 1 diastolic dysfunction -no JVD, no LE edema  Anemia: due to anemia of chronic disease -continue monitoring hemoglobin -Hemoccult positive to do to mild hemorrhoidal bleed -Hgb stable. -continue ferrous sulfate  Weakness and physical deconditioning:  -most likely related to her congestive heart failure/anemia/presumed bronchitis and influenza infection -PT eval recommending SNF; family in agreement, but new decline state and NGT needs are making impossible discharge at this moment. -social work on Midwife (toxic vs hypoxic event vs meds):  -currently unresponsive -d/c lyrica -d/c klonopin -bronchitis infection and decrease O2 due to CHF exacerbation also playing role in her AMS. -per daughter patient with baseline mild dementia -continue supportive care and donepezil -continue tamiflu -if over next 24-36 hours no improvement family will like to meet with PC and transition for comfort care will be made.  Hyperlipidemia. Continue with statin through NGT   Hypertension. Soft but stable. -will slowly resume home BP meds as needed -continue PO lasix for now  Hypothyroidism. Continue with Synthroid   URI due to bronchitis and influenza: will continue levaquin; plan is to treat for a total of 7 days. -continue PRN nebs -continue tamiflu -breathing is stable  Acute on chronic renal failure -due to diuresis and decrease PO intake -close follow up to renal function -lasix dose to be adjusted  Dysphagia -due to  unresponsiveness -will start TF through NGT as per family request; risk and benefits discussed and accepted.  Code Status: full Family Communication: patient and daughter at bedside Disposition Plan:  Needs SNF at discharge    Consultants:  None  Procedures:  Echo:  - Left ventricle: The cavity size was normal. Systolic function was normal. The estimated ejection fraction was in the range of 55% to 60%. Wall motion was normal; there were no regional wall motion abnormalities. Doppler parameters are consistent with abnormal left ventricular relaxation (grade 1 diastolic dysfunction). - Aortic valve: Mild regurgitation. - Mitral valve: Calcified annulus. - Left atrium: The atrium was mildly dilated. - Pulmonary arteries: PA peak pressure: 59m Hg (S).   Antibiotics:  levaquin  HPI/Subjective: Patient unresponsive, afebrile, diffuse rhonchi.  Objective: Filed Vitals:   06/19/13 0657  BP: 146/78  Pulse: 80  Temp: 98.2 F (36.8 C)  Resp: 22    Intake/Output Summary (Last 24 hours) at 06/19/13 1138 Last data filed at 06/19/13 1132  Gross per 24 hour  Intake 828.33 ml  Output      0 ml  Net 828.33 ml   Filed Weights   06/18/13 0438 06/19/13 0446 06/19/13 0657  Weight: 83.3 kg (183 lb 10.3 oz) 82 kg (180 lb 12.4 oz) 80.9 kg (178 lb 5.6 oz)    Exam:   General: unresponsive  Cardiovascular: regular rate, no rubs or gallops, no JVD  Respiratory: no wheezing, slight decreased BS at bases  Abdomen: +Bowel sounds, no distension, no tenderness  Musculoskeletal: moves all 4 ext, no edema bilaterally  Data Reviewed: Basic Metabolic Panel:  Recent Labs Lab 06/15/13 1615 06/15/13 2150 06/16/13 0358 06/18/13 0300 06/18/13 1005 06/19/13 0407  NA 145  --  145 147 149* 146  K 4.5  --  4.0 4.4 3.7 4.0  CL 104  --  102 104 106 103  CO2 32  --  31 29 34* 29  GLUCOSE 82  --  92 62* 96 100*  BUN 39*  --  40* 34* 32* 27*  CREATININE 1.16* 1.18* 1.10 1.21*  1.27* 1.35*  CALCIUM 9.2  --  9.5 9.4 9.4 9.5   Liver Function Tests:  Recent Labs Lab 06/15/13 1615  AST 26  ALT 32  ALKPHOS 59  BILITOT <0.2*  PROT 6.4  ALBUMIN 3.0*   CBC:  Recent Labs Lab 06/15/13 1615 06/15/13 2150 06/16/13 0358 06/16/13 1435 06/18/13 0300  WBC 4.9 4.1 4.0  --  6.5  NEUTROABS 3.2  --   --   --   --   HGB 8.5* 8.6* 8.7* 8.4* 10.2*  HCT 26.1* 27.0* 27.2* 26.0* 31.3*  MCV 94.2 94.4 95.1  --  91.8  PLT 112* 101* 118*  --  75*   BNP (last 3 results)  Recent Labs  06/15/13 1615 06/18/13 0300  PROBNP 458.6* 1139.0*   CBG: No results found for this basename: GLUCAP,  in the last 168 hours  Recent Results (from the past 240 hour(s))  URINE CULTURE     Status: None   Collection Time    06/15/13  5:01 PM      Result Value Ref Range Status   Specimen Description URINE, CLEAN CATCH   Final   Special Requests NONE   Final   Culture  Setup Time     Final   Value: 06/15/2013 22:15     Performed at [place]     Final   Value: 50,000 COLONIES/ML     Performed at [facility]   Culture     Final   Value: Multiple bacterial morphotypes present, none predominant. Suggest appropriate recollection if clinically indicated.     Performed at [facility]   Report Status 06/16/2013 FINAL   Final  RESPIRATORY VIRUS PANEL     Status: Abnormal   Collection Time    06/15/13  5:07 PM      Result Value Ref Range Status   Source - RVPAN NASAL SWAB   Corrected   Comment: CORRECTED ON 02/09 AT 1851: PREVIOUSLY REPORTED AS NASAL SWAB   Respiratory Syncytial Virus A NOT DETECTED   Final   Respiratory Syncytial Virus B NOT DETECTED   Final   Influenza A DETECTED (*)  Final   Influenza B NOT DETECTED   Final   Parainfluenza 1 NOT DETECTED   Final   Parainfluenza 2 NOT DETECTED   Final   Parainfluenza 3 NOT DETECTED   Final   Metapneumovirus NOT DETECTED   Final   Rhinovirus NOT DETECTED   Final   Adenovirus NOT  DETECTED   Final   Influenza A H1 NOT DETECTED   Final   Influenza A H3 NOT DETECTED   Final   Comment: (NOTE)           Normal Reference Range for each Analyte: NOT DETECTED     Testing performed using the Luminex xTAG Respiratory Viral Panel test     kit.     This test was developed and its performance characteristics determined     by [facility]. It has not been cleared or approved by the [place]     Food and Drug Administration. This test is used for clinical purposes.  It should not be regarded as investigational or for research. This     laboratory is certified under the [place] (CLIA) as qualified to perform high complexity     clinical laboratory testing.     Performed at [facility]     Studies: Dg Chest 2 View  06/18/2013   CLINICAL DATA:  Shortness of Breath  EXAM: CHEST  2 VIEW  COMPARISON:  June 15, 2013  FINDINGS: There is cardiomegaly with mild pulmonary venous hypertension, stable. There is mild atelectatic change in the left base. There is no frank edema or consolidation. No adenopathy. There is degenerative change in the thoracic spine.  IMPRESSION: Findings suggesting a degree of volume overload but no frank edema or consolidation. There is mild stable atelectasis in the left base. No pneumothorax.   Electronically Signed   By: Lowella Grip M.D.   On: 06/18/2013 07:46   Mr Brain Wo Contrast  06/18/2013   CLINICAL DATA:  History of mild dimension. Gradual increased weakness and confusion. History of hypertension and hyperlipidemia.  EXAM: MRI HEAD WITHOUT CONTRAST  TECHNIQUE: Multiplanar, multiecho pulse sequences of the brain and surrounding structures were obtained without intravenous contrast.  COMPARISON:  06/15/2013 CT.  No comparison MR.  FINDINGS: No acute infarct  No intracranial hemorrhage.  Remote right occipital lobe infarct with encephalomalacia.  Mild global atrophy without hydrocephalus.  No  intracranial mass lesion noted on this unenhanced exam.  Major intracranial vascular structures are patent.  Mild paranasal sinus mucosal thickening.  Exophthalmos.  Cervical medullary junction unremarkable. Mild cervical spondylotic changes C3-4. Mild transverse ligament hypertrophy.  IMPRESSION: No acute infarct  Remote right occipital lobe infarct with encephalomalacia.  Mild global atrophy without hydrocephalus.  No intracranial mass lesion noted on this unenhanced exam.  Mild paranasal sinus mucosal thickening.   Electronically Signed   By: Chauncey Cruel M.D.   On: 06/18/2013 17:57    Scheduled Meds: . atorvastatin  20 mg Oral q1800  . donepezil  10 mg Oral QHS  . ferrous sulfate  300 mg Oral BID WC  . heparin  5,000 Units Subcutaneous 3 times per day  . hydrocortisone   Rectal BID  . levothyroxine  137 mcg Oral QAC breakfast  . oseltamivir  30 mg Oral BID  . potassium chloride  10 mEq Oral Q7 days  . sodium chloride  3 mL Intravenous Q12H   Time spent: 35 min   Caiden Monsivais  [facility] Pager [phone]. If 7PM-7AM, please contact night-coverage at [email or web address], password [facility] 06/19/2013, 11:38 AM  LOS: 4 days

## 2013-06-20 LAB — GLUCOSE, CAPILLARY
GLUCOSE-CAPILLARY: 112 mg/dL — AB (ref 70–99)
GLUCOSE-CAPILLARY: 114 mg/dL — AB (ref 70–99)
Glucose-Capillary: 109 mg/dL — ABNORMAL HIGH (ref 70–99)
Glucose-Capillary: 121 mg/dL — ABNORMAL HIGH (ref 70–99)
Glucose-Capillary: 124 mg/dL — ABNORMAL HIGH (ref 70–99)
Glucose-Capillary: 138 mg/dL — ABNORMAL HIGH (ref 70–99)

## 2013-06-20 LAB — BASIC METABOLIC PANEL
BUN: 23 mg/dL (ref 6–23)
CO2: 27 meq/L (ref 19–32)
Calcium: 9.6 mg/dL (ref 8.4–10.5)
Chloride: 106 mEq/L (ref 96–112)
Creatinine, Ser: 1.14 mg/dL — ABNORMAL HIGH (ref 0.50–1.10)
GFR calc non Af Amer: 40 mL/min — ABNORMAL LOW (ref >90)
GFR, EST AFRICAN AMERICAN: 47 mL/min — AB (ref >90)
GLUCOSE: 126 mg/dL — AB (ref 70–99)
POTASSIUM: 3.8 meq/L (ref 3.7–5.3)
Sodium: 146 mEq/L (ref 137–147)

## 2013-06-20 MED ORDER — JEVITY 1.2 CAL PO LIQD
1000.0000 mL | ORAL | Status: DC
  Administered 2013-06-20 – 2013-06-21 (×2): 1000 mL
  Filled 2013-06-20 (×3): qty 1000

## 2013-06-20 NOTE — Progress Notes (Signed)
NUTRITION FOLLOW UP  Intervention:   Increase Jevity 1.2 to 30 ml/hr via NGT and increase by 10 ml every 4 hours to goal rate of 55 ml/hr. 30 ml Prostat once daily. At goal rate, tube feeding regimen will provide 1684 kcal, 88 grams of protein, and 1069 ml of H2O. Diet advancement per SLP/MD RD to continue to monitor nutrition care plan and goals of care   Nutrition Dx:   Inadequate oral intake related to inability to eat as evidenced by NPO status; ongoing  Goal:   Pt to meet >/= 90% of their estimated nutrition needs; unmet  Monitor:   TF initiation/tolerance, I/O's, weight trends, labs, goals of care  Assessment:   80 y.o. female, with history of mild dementia, wheelchair dependent, brought by her daughter for multiple complaints, including generalized weakness over the last week, with one fall during transfer from bed to wheelchair, cough for last 24 hours, generalized weakness and confusion, and shortness of breath, daughter reports she had upper respiratory infection recently, as well noticed in her mother having diarrhea x24 hours.   2/11: Per pt's daughter pt is comatose and unable to swallow. Pt has NGT in place. RD consulted for TF management. Pt's daughter states that pt usually weighs 203 lbs and pt has not been eating well the past few days. Per MD note, if over next 24-36 hours no improvement family will like to meet with PC and transition for comfort care will be made. Pt's weight on admission (2/7) was 192 lbs; pt has had 7% weight loss in the past 4 days.   2/12: NGT in place with Jevity 1.2 running at 20 ml/hr; TF rate has not been advanced. Per nursing notes last BM was 2/7 and residuals have been 10-20 ml. Per RN no residuals last time checked. Pt awake at time of visit with mitts on.   Labs: low GFR, low hemoglobin   Height: Ht Readings from Last 1 Encounters:  06/15/13 5\' 10"  (1.778 m)    Weight Status:   Wt Readings from Last 1 Encounters:  06/20/13 178 lb 5.6  oz (80.9 kg)    Re-estimated needs:  Kcal: 1575-1710  Protein: 90-100 grams  Fluid: 1.5-1.7 L/day  Skin: +1 RLE and LLE edema; stage 2 pressure ulcer on ankle  Diet Order: NPO   Intake/Output Summary (Last 24 hours) at 06/20/13 1531 Last data filed at 06/20/13 0653  Gross per 24 hour  Intake  871.5 ml  Output      0 ml  Net  871.5 ml    Last BM: 2/7   Labs:   Recent Labs Lab 06/18/13 1005 06/19/13 0407 06/20/13 0632  NA 149* 146 146  K 3.7 4.0 3.8  CL 106 103 106  CO2 34* 29 27  BUN 32* 27* 23  CREATININE 1.27* 1.35* 1.14*  CALCIUM 9.4 9.5 9.6  GLUCOSE 96 100* 126*    CBG (last 3)   Recent Labs  06/20/13 0637 06/20/13 0907 06/20/13 1155  GLUCAP 109* 112* 121*    Scheduled Meds: . atorvastatin  20 mg Oral q1800  . donepezil  10 mg Oral QHS  . feeding supplement (PRO-STAT SUGAR FREE 64)  30 mL Per Tube Q24H  . ferrous sulfate  300 mg Oral BID WC  . heparin  5,000 Units Subcutaneous 3 times per day  . hydrocortisone   Rectal BID  . levothyroxine  137 mcg Oral QAC breakfast  . oseltamivir  30 mg Oral BID  .  potassium chloride  10 mEq Oral Q7 days  . sodium chloride  3 mL Intravenous Q12H    Continuous Infusions: . dextrose 5 % and 0.45% NaCl 30 mL/hr at 06/19/13 1329  . feeding supplement (JEVITY 1.2 CAL) 20 mL/hr at 06/19/13 [place], LDN Inpatient Clinical Dietitian Pager: [phone] After Hours Pager: [phone]

## 2013-06-20 NOTE — Progress Notes (Signed)
The patient is alert to self.  She was talking to herself a large part of the night and tried to pull out her NG tube intermittently the second half of the shift.  K. Kirby was text paged concerning obtaining a sitter for day shift.  The RN sat outside of the patient's door to watch her for most of the second part of the shift.  She could answer yes/no questions and was coherent when her attention was redirected.  She talked with the charge nurse and the primary RN at 0430 when changing the tape on her nose and had some requests that were met.  She stated that she was hot and wanted the sheet completely off of her.  She was reoriented to her situation several times throughout the night and seemed to understand the situation at those times.   

## 2013-06-20 NOTE — Progress Notes (Signed)
Patient has been referred to both [facility] and [facility] as patient currently has an NG tube and this cannot be accepted by a SNF.  CSW received notice from [facility] that they would accept patient once bed is availble. She will re-evaluate in the a.m. CSW discussed with [place].  Lorie Phenix. [place], [place]

## 2013-06-20 NOTE — Progress Notes (Signed)
PROGRESS NOTE  Alexandra Lucas YBO:[id number] DOB: 22-Feb-1918 DOA: 06/15/2013 PCP: Maggie Font, MD  Assessment/Plan: Acute on chronic diastolic congestive heart failure:  -continue low sodium diet once able to eat, strict I's/O's and daily weights -CXR on 2/10 demonstrating no edema or frank consolidations. Atelectasis in LLL still present. -Lasix IV currently, given NPO status -2-D echo with preserved EF and demonstrating grade 1 diastolic dysfunction -no JVD, no LE edema  Anemia: due to anemia of chronic disease -continue monitoring hemoglobin -Hemoccult positive to do to mild hemorrhoidal bleed -Hgb stable. -continue ferrous sulfate  Weakness and physical deconditioning:  -most likely related to her congestive heart failure/anemia/presumed bronchitis and influenza infection -PT eval recommending SNF; family in agreement, but new decline state and NGT needs are making impossible discharge at this moment. -social work on Midwife (toxic vs hypoxic event vs meds):  -currently unresponsive -d/c lyrica -d/c klonopin -bronchitis infection and decrease O2 due to CHF exacerbation also playing role in her AMS. -per daughter patient with baseline mild dementia -continue supportive care and donepezil -positive influenza a -continue tamiflu -patient is more alert today, but still difficult to understand; was able to answer yes or no to questions -MRI negative for acute stroke -will follow response   Hyperlipidemia. Continue with statin through NGT   Hypertension. Soft but stable. -will slowly resume home BP meds as needed -continue PO lasix for now  Hypothyroidism. Continue with Synthroid   URI due to bronchitis and influenza: will continue levaquin; plan is to treat for a total of 7 days. -continue PRN nebs -continue tamiflu -breathing is stable  Acute on chronic renal failure -due to diuresis and decrease PO intake -close follow up to  renal function -lasix dose to be adjusted  Dysphagia -more responsive today -will continue TF through NGT as per family request; risk and benefits discussed and accepted. -will ask SPL to evaluate patient   Code Status: full Family Communication: patient and daughter at bedside Disposition Plan:  Needs SNF vs LTAC at discharge (especially if plan is to continue NGT)   Consultants:  None  Procedures:  Echo:  - Left ventricle: The cavity size was normal. Systolic function was normal. The estimated ejection fraction was in the range of 55% to 60%. Wall motion was normal; there were no regional wall motion abnormalities. Doppler parameters are consistent with abnormal left ventricular relaxation (grade 1 diastolic dysfunction). - Aortic valve: Mild regurgitation. - Mitral valve: Calcified annulus. - Left atrium: The atrium was mildly dilated. - Pulmonary arteries: PA peak pressure: 26mm Hg (S).   Antibiotics:  levaquin  HPI/Subjective: Patient more responsive today, afebrile, diffuse rhonchi. Able to answer yes or no to questions and follow simple commands (ask to squeeze fingers, move legs and so) speech was still incoherent  Objective: Filed Vitals:   06/20/13 2203  BP: 187/82  Pulse: 66  Temp: 97.8 F (36.6 C)  Resp: 20    Intake/Output Summary (Last 24 hours) at 06/20/13 2333 Last data filed at 06/20/13 2778  Gross per 24 hour  Intake  757.5 ml  Output      0 ml  Net  757.5 ml   Filed Weights   06/19/13 0446 06/19/13 0657 06/20/13 1500  Weight: 82 kg (180 lb 12.4 oz) 80.9 kg (178 lb 5.6 oz) 80.9 kg (178 lb 5.6 oz)    Exam:   General: able to follow some simple commands, answer yes and no  Cardiovascular: regular rate, no rubs or gallops,  no JVD  Respiratory: no wheezing, slight decreased BS at bases; scattered rhonchi  Abdomen: +Bowel sounds, no distension, no tenderness  Musculoskeletal: moves all 4 ext, no edema bilaterally  Data  Reviewed: Basic Metabolic Panel:  Recent Labs Lab 06/16/13 0358 06/18/13 0300 06/18/13 1005 06/19/13 0407 06/20/13 0632  NA 145 147 149* 146 146  K 4.0 4.4 3.7 4.0 3.8  CL 102 104 106 103 106  CO2 31 29 34* 29 27  GLUCOSE 92 62* 96 100* 126*  BUN 40* 34* 32* 27* 23  CREATININE 1.10 1.21* 1.27* 1.35* 1.14*  CALCIUM 9.5 9.4 9.4 9.5 9.6   Liver Function Tests:  Recent Labs Lab 06/15/13 1615  AST 26  ALT 32  ALKPHOS 59  BILITOT <0.2*  PROT 6.4  ALBUMIN 3.0*   CBC:  Recent Labs Lab 06/15/13 1615 06/15/13 2150 06/16/13 0358 06/16/13 1435 06/18/13 0300  WBC 4.9 4.1 4.0  --  6.5  NEUTROABS 3.2  --   --   --   --   HGB 8.5* 8.6* 8.7* 8.4* 10.2*  HCT 26.1* 27.0* 27.2* 26.0* 31.3*  MCV 94.2 94.4 95.1  --  91.8  PLT 112* 101* 118*  --  75*   BNP (last 3 results)  Recent Labs  06/15/13 1615 06/18/13 0300  PROBNP 458.6* 1139.0*   CBG:  Recent Labs Lab 06/20/13 0637 06/20/13 0907 06/20/13 1155 06/20/13 1545 06/20/13 1956  GLUCAP 109* 112* 121* 114* 138*    Recent Results (from the past 240 hour(s))  URINE CULTURE     Status: None   Collection Time    06/15/13  5:01 PM      Result Value Ref Range Status   Specimen Description URINE, CLEAN CATCH   Final   Special Requests NONE   Final   Culture  Setup Time     Final   Value: 06/15/2013 22:15     Performed at [facility]     Final   Value: 50,000 COLONIES/ML     Performed at [facility]   Culture     Final   Value: Multiple bacterial morphotypes present, none predominant. Suggest appropriate recollection if clinically indicated.     Performed at [facility]   Report Status 06/16/2013 FINAL   Final  RESPIRATORY VIRUS PANEL     Status: Abnormal   Collection Time    06/15/13  5:07 PM      Result Value Ref Range Status   Source - RVPAN NASAL SWAB   Corrected   Comment: CORRECTED ON 02/09 AT 1851: PREVIOUSLY REPORTED AS NASAL SWAB   Respiratory Syncytial  Virus A NOT DETECTED   Final   Respiratory Syncytial Virus B NOT DETECTED   Final   Influenza A DETECTED (*)  Final   Influenza B NOT DETECTED   Final   Parainfluenza 1 NOT DETECTED   Final   Parainfluenza 2 NOT DETECTED   Final   Parainfluenza 3 NOT DETECTED   Final   Metapneumovirus NOT DETECTED   Final   Rhinovirus NOT DETECTED   Final   Adenovirus NOT DETECTED   Final   Influenza A H1 NOT DETECTED   Final   Influenza A H3 NOT DETECTED   Final   Comment: (NOTE)           Normal Reference Range for each Analyte: NOT DETECTED     Testing performed using the Luminex xTAG Respiratory Viral Panel test  kit.     This test was developed and its performance characteristics determined     by [facility]. It has not been cleared or approved by the [place]     Food and Drug Administration. This test is used for clinical purposes.     It should not be regarded as investigational or for research. This     laboratory is certified under the [place] (CLIA) as qualified to perform high complexity     clinical laboratory testing.     Performed at [facility]     Studies: No results found.  Scheduled Meds: . atorvastatin  20 mg Oral q1800  . donepezil  10 mg Oral QHS  . feeding supplement (PRO-STAT SUGAR FREE 64)  30 mL Per Tube Q24H  . ferrous sulfate  300 mg Oral BID WC  . heparin  5,000 Units Subcutaneous 3 times per day  . hydrocortisone   Rectal BID  . levothyroxine  137 mcg Oral QAC breakfast  . oseltamivir  30 mg Oral BID  . potassium chloride  10 mEq Oral Q7 days  . sodium chloride  3 mL Intravenous Q12H   Time spent: 35 min   Jahbari Repinski  [facility] Pager [phone]. If 7PM-7AM, please contact night-coverage at [email or web address], password [facility] 06/20/2013, 11:33 PM  LOS: 5 days

## 2013-06-21 DIAGNOSIS — Z66 Do not resuscitate: Secondary | ICD-10-CM

## 2013-06-21 DIAGNOSIS — Z515 Encounter for palliative care: Secondary | ICD-10-CM

## 2013-06-21 LAB — BASIC METABOLIC PANEL
BUN: 21 mg/dL (ref 6–23)
CHLORIDE: 105 meq/L (ref 96–112)
CO2: 26 meq/L (ref 19–32)
Calcium: 9.4 mg/dL (ref 8.4–10.5)
Creatinine, Ser: 0.92 mg/dL (ref 0.50–1.10)
GFR calc Af Amer: 60 mL/min — ABNORMAL LOW (ref >90)
GFR calc non Af Amer: 52 mL/min — ABNORMAL LOW (ref >90)
GLUCOSE: 103 mg/dL — AB (ref 70–99)
POTASSIUM: 3.9 meq/L (ref 3.7–5.3)
Sodium: 145 mEq/L (ref 137–147)

## 2013-06-21 LAB — CBC
HEMATOCRIT: 30.5 % — AB (ref 36.0–46.0)
HEMOGLOBIN: 10.1 g/dL — AB (ref 12.0–15.0)
MCH: 30.3 pg (ref 26.0–34.0)
MCHC: 33.1 g/dL (ref 30.0–36.0)
MCV: 91.6 fL (ref 78.0–100.0)
Platelets: 91 10*3/uL — ABNORMAL LOW (ref 150–400)
RBC: 3.33 MIL/uL — AB (ref 3.87–5.11)
RDW: 16.5 % — ABNORMAL HIGH (ref 11.5–15.5)
WBC: 4.4 10*3/uL (ref 4.0–10.5)

## 2013-06-21 LAB — GLUCOSE, CAPILLARY
GLUCOSE-CAPILLARY: 139 mg/dL — AB (ref 70–99)
GLUCOSE-CAPILLARY: 120 mg/dL — AB (ref 70–99)
Glucose-Capillary: 102 mg/dL — ABNORMAL HIGH (ref 70–99)

## 2013-06-21 MED ORDER — LORAZEPAM 2 MG/ML IJ SOLN: 0.5000 mg | INTRAMUSCULAR | Status: DC | PRN

## 2013-06-21 MED ORDER — BISACODYL 10 MG RE SUPP: 10.0000 mg | Freq: Every day | RECTAL | Status: DC | PRN

## 2013-06-21 MED ORDER — MORPHINE SULFATE (CONCENTRATE) 10 MG /0.5 ML PO SOLN: 2.5000 mg | ORAL | Status: DC | PRN

## 2013-06-21 MED ORDER — DICLOFENAC SODIUM 1 % TD GEL
4.0000 g | Freq: Every day | TRANSDERMAL | Status: DC
  Administered 2013-06-21: 4 g via TOPICAL
  Filled 2013-06-21: qty 100

## 2013-06-21 MED ORDER — SCOPOLAMINE 1 MG/3DAYS TD PT72
1.0000 | MEDICATED_PATCH | TRANSDERMAL | Status: DC
  Administered 2013-06-21: 1.5 mg via TRANSDERMAL
  Filled 2013-06-21: qty 1

## 2013-06-21 NOTE — Progress Notes (Signed)
Patient is now referred for [facility] placement after daughter met with Palliative Care.  Her NGT has been removed and she is being placed on comfort care.  CSW met with patient's daughter this afternoon to discuss hospice home options and she was given list. Her first choice is [facility], then either [facility] or [facility].  CSW notified [facility]- patient now on wait list for review but they do not have any vacancies at this time.  Referrals completed to both [facility] and [facility] hospice homes.  Support provided to daughter; patient will open her eyes at times but does not respond verbally.  Lorie Phenix. [place], [place]

## 2013-06-21 NOTE — Progress Notes (Signed)
PROGRESS NOTE  Alexandra Lucas KVQ:[id number] DOB: [birth year] DOA: 06/15/2013 PCP: Maggie Font, MD  Assessment/Plan: Acute on chronic diastolic congestive heart failure:  -comfort feeding  -CXR on 2/10 demonstrating no edema or frank consolidations. Atelectasis in LLL still present. -now that plan is for comfort measures will discontinue any aggressive treatment and use morphine for comfort and SOB -2-D echo with preserved EF and demonstrating grade 1 diastolic dysfunction -no JVD, no LE edema  Anemia: due to anemia of chronic disease -plan is for comfort care -ferrous sulfate discontinue  Weakness and physical deconditioning:  -most likely related to her congestive heart failure/anemia/presumed bronchitis and influenza infection -Patient has now become unresponsive and after [place] meeting with Palliative care plan is for full comfort care -SW aware and searching for residential hospice  Confusion/dementia/encephalopathy (toxic vs hypoxic event vs meds):  -currently unresponsive -positive influenza a -plan is for comfort now after [place] meeting with Palliative care -patient is more alert today, but still difficult to understand; was able to answer yes or no to questions -MRI negative for acute stroke -will follow response   Hyperlipidemia.  -statins discontinue  -full comfort now   Hypertension. -full comfort care -will stop antihypertensive agents as patient unable to safely swallow  Hypothyroidism. -comfort care  URI due to bronchitis and influenza: -comfort care now -no SOB -PRN albuterol nebs -morphine for SOB  Acute on chronic renal failure -dno further blood work will be drawn  -plan is for full comfort care  Dysphagia -back to unresponsive state -family wants comfort -will allow sips and dysphagia 1 for comfort feeding  Code Status: full Family Communication: patient and daughter at bedside Disposition Plan:  Needs SNF vs LTAC at discharge (especially  if plan is to continue NGT)   Consultants:  Palliative care  Procedures:  Echo:  - Left ventricle: The cavity size was normal. Systolic function was normal. The estimated ejection fraction was in the range of 55% to 60%. Wall motion was normal; there were no regional wall motion abnormalities. Doppler parameters are consistent with abnormal left ventricular relaxation (grade 1 diastolic dysfunction). - Aortic valve: Mild regurgitation. - Mitral valve: Calcified annulus. - Left atrium: The atrium was mildly dilated. - Pulmonary arteries: PA peak pressure: 76mm Hg (S).   Antibiotics:  levaquin stopped on 2/13  HPI/Subjective: Patient has become unresponsive and barely alertness with painful stimuli; prognosis poor and after discussion with PC and following [place] meeting, plan is for full comfort care.  Objective: Filed Vitals:   06/21/13 1421  BP: 149/58  Pulse: 83  Temp:   Resp: 22    Intake/Output Summary (Last 24 hours) at 06/21/13 1630 Last data filed at 06/21/13 0600  Gross per 24 hour  Intake   1020 ml  Output      0 ml  Net   1020 ml   Filed Weights   06/19/13 0657 06/20/13 1500 06/21/13 0450  Weight: 80.9 kg (178 lb 5.6 oz) 80.9 kg (178 lb 5.6 oz) 79.8 kg (175 lb 14.8 oz)    Exam:   General: unresponsive, no SOB, no pain or moaning  Cardiology: S1 and S2, positive SEM  Respiratory: no wheezing, slight decreased BS at bases; scattered rhonchi  Abdomen: +Bowel sounds, no distension, no tenderness  Musculoskeletal: moves all 4 ext, no edema bilaterally  Data Reviewed: Basic Metabolic Panel:  Recent Labs Lab 06/18/13 0300 06/18/13 1005 06/19/13 0407 06/20/13 0632 06/21/13 0421  NA 147 149* 146 146 145  K 4.4  3.7 4.0 3.8 3.9  CL 104 106 103 106 105  CO2 29 34* $Remo'29 27 26  'MopcN$ GLUCOSE 62* 96 100* 126* 103*  BUN 34* 32* 27* 23 21  CREATININE 1.21* 1.27* 1.35* 1.14* 0.92  CALCIUM 9.4 9.4 9.5 9.6 9.4   Liver Function Tests:  Recent Labs Lab  06/15/13 1615  AST 26  ALT 32  ALKPHOS 59  BILITOT <0.2*  PROT 6.4  ALBUMIN 3.0*   CBC:  Recent Labs Lab 06/15/13 1615 06/15/13 2150 06/16/13 0358 06/16/13 1435 06/18/13 0300 06/21/13 0421  WBC 4.9 4.1 4.0  --  6.5 4.4  NEUTROABS 3.2  --   --   --   --   --   HGB 8.5* 8.6* 8.7* 8.4* 10.2* 10.1*  HCT 26.1* 27.0* 27.2* 26.0* 31.3* 30.5*  MCV 94.2 94.4 95.1  --  91.8 91.6  PLT 112* 101* 118*  --  75* 91*   BNP (last 3 results)  Recent Labs  06/15/13 1615 06/18/13 0300  PROBNP 458.6* 1139.0*   CBG:  Recent Labs Lab 06/20/13 1545 06/20/13 1956 06/21/13 0425 06/21/13 0832 06/21/13 1154  GLUCAP 114* 138* 102* 139* 120*    Recent Results (from the past 240 hour(s))  URINE CULTURE     Status: None   Collection Time    06/15/13  5:01 PM      Result Value Ref Range Status   Specimen Description URINE, CLEAN CATCH   Final   Special Requests NONE   Final   Culture  Setup Time     Final   Value: 06/15/2013 22:15     Performed at [facility]     Final   Value: 50,000 COLONIES/ML     Performed at [facility]   Culture     Final   Value: Multiple bacterial morphotypes present, none predominant. Suggest appropriate recollection if clinically indicated.     Performed at [facility]   Report Status 06/16/2013 FINAL   Final  RESPIRATORY VIRUS PANEL     Status: Abnormal   Collection Time    06/15/13  5:07 PM      Result Value Ref Range Status   Source - RVPAN NASAL SWAB   Corrected   Comment: CORRECTED ON 02/09 AT 1851: PREVIOUSLY REPORTED AS NASAL SWAB   Respiratory Syncytial Virus A NOT DETECTED   Final   Respiratory Syncytial Virus B NOT DETECTED   Final   Influenza A DETECTED (*)  Final   Influenza B NOT DETECTED   Final   Parainfluenza 1 NOT DETECTED   Final   Parainfluenza 2 NOT DETECTED   Final   Parainfluenza 3 NOT DETECTED   Final   Metapneumovirus NOT DETECTED   Final   Rhinovirus NOT DETECTED   Final    Adenovirus NOT DETECTED   Final   Influenza A H1 NOT DETECTED   Final   Influenza A H3 NOT DETECTED   Final   Comment: (NOTE)           Normal Reference Range for each Analyte: NOT DETECTED     Testing performed using the Luminex xTAG Respiratory Viral Panel test     kit.     This test was developed and its performance characteristics determined     by [facility]. It has not been cleared or approved by the [place]     Food and Drug Administration. This test is used for clinical purposes.  It should not be regarded as investigational or for research. This     laboratory is certified under the [place] (CLIA) as qualified to perform high complexity     clinical laboratory testing.     Performed at [facility]     Studies: No results found.  Scheduled Meds: . diclofenac sodium  4 g Topical QHS  . hydrocortisone   Rectal BID  . sodium chloride  3 mL Intravenous Q12H   Time spent: 35 min   [facility] Pager [phone]. If 7PM-7AM, please contact night-coverage at [email or web address], password [facility] 06/21/2013, 4:30 PM  LOS: 6 days

## 2013-06-21 NOTE — Consult Note (Signed)
Patient Alexandra Lucas      DOB: [birth year]      VOJ:[id number]     Consult Note from the Palliative Medicine Team at [facility] Requested by: Dr. Dyann Kief      PCP: Maggie Font, MD Reason for Consultation: [place] and options.     Phone Number:[phone]  Assessment of patients Current state: 80 yo female admitted with + influenza A, acute on chronic diastolic heart failure, weakness, and dysphagia. I spoke with Ms. Chiang's daughter, Vito Backers [phone]), who tells me she was living at the [facility] for 8-9 months and was living the last 3-4 months with [place]. She was wheelchair bound but able to transfer herself in and out of chair to bed/bathroom. Vito Backers says that she never noticed any mental deficiencies in her mother related to her dementia and says that she was started on Aricept when she labeled the numbers on a clock vertically on a MMSE. Vito Backers says that Ms. Standen has taken a drastic turn the last couple weeks and has been declining since admission on 2/7. Vito Backers says that her mother has looked worse since yesterday as she was speaking yesterday and is now unresponsive. Vito Backers says that she spoke with her sister and they agree to transition Ms. Sessums to comfort care at this point. We discussed removing the NGT and that if she were to be more alert we could try comfort feeds. We also discussed taking her off telemetry and she does not want aggressive care.   Ms. Derouin has three children but Vito Backers is the only one living here and says she has been updating her brother and sister. Ms. 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 Daughter Vito Backers is a Designer, jewellery and very realisitic with her mother's health. She understands her prognosis is poor and likely weeks or less.    Goals of Care: 1.  Code Status: DNR   2. Scope of Treatment: 1. Vital Signs: daily 2. Respiratory/Oxygen: comfort only 3. Nutritional Support/Tube Feeds:  no 4. Review of Medications to be discontinued: Minimized for comfort. 5. Labs: no 6. Telemetry: no 7. Consults: spiritual care   4. Disposition: Hopeful for hospice facility.   3. Symptom Management:   1. Anxiety/Agitation: Lorazepam prn. 2. Pain: Roxanol prn. 3. Bowel Regimen: Dulcolax supp prn.  4. Fever: Acetaminophen prn. 5. Nausea/Vomiting: Ondansetron prn. 6. Will also place urinary foley catheter for comfort.  4. Psychosocial: Emotional support provided to Ms. Kawano and Cassandra.   5. Spiritual: Spiritual care consult placed as requested by family.    Patient Documents Completed or Given: Document Given Completed  Advanced Directives Pkt    MOST    DNR  yes  Gone from My Sight yes   Hard Choices      Brief HPI: 80 yo female with CHF and influenza A.   ROS: Unable to assess - unresponsive.    PMH:  Past Medical History  Diagnosis Date  . Hypertension   . Stroke   . Thyroid disease   . Hyperlipidemia      PSH: Past Surgical History  Procedure Laterality Date  . Bladder suspension     I have reviewed the [place] and SH and  If appropriate update it with new information. Allergies  Allergen Reactions  . Aspirin Nausea Only   Scheduled Meds: . atorvastatin  20 mg Oral q1800  . donepezil  10 mg Oral QHS  . feeding supplement (PRO-STAT SUGAR FREE 64)  30  mL Per Tube Q24H  . ferrous sulfate  300 mg Oral BID WC  . heparin  5,000 Units Subcutaneous 3 times per day  . hydrocortisone   Rectal BID  . levothyroxine  137 mcg Oral QAC breakfast  . oseltamivir  30 mg Oral BID  . potassium chloride  10 mEq Oral Q7 days  . sodium chloride  3 mL Intravenous Q12H   Continuous Infusions: . dextrose 5 % and 0.45% NaCl 30 mL/hr at 06/20/13 2249  . feeding supplement (JEVITY 1.2 CAL) 1,000 mL (06/21/13 0145)   PRN Meds:.acetaminophen, acetaminophen, albuterol, alum & mag hydroxide-simeth, bismuth subsalicylate, guaifenesin, ondansetron (ZOFRAN) IV, ondansetron,  oxyCODONE, zolpidem    BP 157/85  Pulse 83  Temp(Src) 97.8 F (36.6 C) (Oral)  Resp 20  Ht 5\' 10"  (1.778 m)  Wt 79.8 kg (175 lb 14.8 oz)  BMI 25.24 kg/m2  SpO2 100%   PPS: 20%   Intake/Output Summary (Last 24 hours) at 06/21/13 1305 Last data filed at 06/21/13 0600  Gross per 24 hour  Intake   1020 ml  Output      0 ml  Net   1020 ml   LBM: 06/21/13                  Physical Exam:  General: NAD, unresponsive, ill appearing HEENT:  [place]/AT, no JVD, dry mucous membranes Chest: Rhonchi throughout, decreased bases CVS: RRR, S1 S2 Abdomen: Soft, NT, ND, +BS Ext: BLE trace edema, warm to touch Neuro: Unresponsive  Labs: CBC    Component Value Date/Time   WBC 4.4 06/21/2013 0421   WBC 4.9 06/28/2011 0923   RBC 3.33* 06/21/2013 0421   RBC 2.86* 06/15/2013 2150   RBC 3.31* 06/28/2011 0923   HGB 10.1* 06/21/2013 0421   HGB 10.0* 06/28/2011 0923   HCT 30.5* 06/21/2013 0421   HCT 30.5* 06/28/2011 0923   PLT 91* 06/21/2013 0421   PLT 161 06/28/2011 0923   MCV 91.6 06/21/2013 0421   MCV 92.2 06/28/2011 0923   MCH 30.3 06/21/2013 0421   MCH 30.2 06/28/2011 0923   MCHC 33.1 06/21/2013 0421   MCHC 32.8 06/28/2011 0923   RDW 16.5* 06/21/2013 0421   RDW 16.6* 06/28/2011 0923   LYMPHSABS 0.9 06/15/2013 1615   LYMPHSABS 2.0 06/28/2011 0923   MONOABS 0.7 06/15/2013 1615   MONOABS 0.7 06/28/2011 0923   EOSABS 0.0 06/15/2013 1615   EOSABS 0.0 06/28/2011 0923   BASOSABS 0.0 06/15/2013 1615   BASOSABS 0.0 06/28/2011 0923    BMET    Component Value Date/Time   NA 145 06/21/2013 0421   K 3.9 06/21/2013 0421   CL 105 06/21/2013 0421   CO2 26 06/21/2013 0421   GLUCOSE 103* 06/21/2013 0421   BUN 21 06/21/2013 0421   CREATININE 0.92 06/21/2013 0421   CALCIUM 9.4 06/21/2013 0421   GFRNONAA 52* 06/21/2013 0421   GFRAA 60* 06/21/2013 0421    CMP     Component Value Date/Time   NA 145 06/21/2013 0421   K 3.9 06/21/2013 0421   CL 105 06/21/2013 0421   CO2 26 06/21/2013 0421   GLUCOSE 103* 06/21/2013 0421   BUN  21 06/21/2013 0421   CREATININE 0.92 06/21/2013 0421   CALCIUM 9.4 06/21/2013 0421   PROT 6.4 06/15/2013 1615   ALBUMIN 3.0* 06/15/2013 1615   AST 26 06/15/2013 1615   ALT 32 06/15/2013 1615   ALKPHOS 59 06/15/2013 1615   BILITOT <0.2* 06/15/2013 1615   GFRNONAA  52* 06/21/2013 0421   GFRAA 60* 06/21/2013 0421     Time In Time Out Total Time Spent with Patient Total Overall Time  1215 1325 45min 67min    Greater than 50%  of this time was spent counseling and coordinating care related to the above assessment and plan.  Vinie Sill, NP Palliative Medicine Team Pager # [phone] Team Phone # [phone]

## 2013-06-21 NOTE — Progress Notes (Signed)
Per [place]- bed offer from [facility] is no longer in place. Patient will require SNF placement as previously discussed with patient's daughter. Cannot place patient with and NGT; MD is aware. Patient discussed in progression meeting this a.m.  MD will consider Palliative Care order for goals of care-  Can place patient if G tube is inserted or if comfort measures are initiated.  Lorie Phenix. [place], [place]

## 2013-06-21 NOTE — Evaluation (Signed)
Clinical/Bedside Swallow Evaluation Patient Details  Name: LASHEBA STEVENS MRN: [id number] Date of Birth: [birth year]  Today's Date: 06/21/2013 Time: 4132-4401 SLP Time Calculation (min): 17 min  Past Medical History:  Past Medical History  Diagnosis Date  . Hypertension   . Stroke   . Thyroid disease   . Hyperlipidemia    Past Surgical History:  Past Surgical History  Procedure Laterality Date  . Bladder suspension     HPI:  Franny Mandich is a 80 y.o. female, with history of mild dementia, wheelchair dependent, brought by her daughter for multiple complaints, including generalized weakness over the last week, with one fall during transfer from bed to wheelchair, cough for last 24 hours,, generalized weakness and confusion, and shortness of breath, daughter reports she had upper respiratory infection recently, as well noticed in her mother having diarrhea x24 hours, patient chest x-ray did not show any acute findings. Pt with presumed influenza and CHF. Pt with decreased responsiveness, NG tube for feeding but not able to d/c pt with NG in place.    Assessment / Plan / Recommendation Clinical Impression  Despite max cueing and stimulation, pt does not appropriately arouse, does not respond to PO. Recommend RN be given the freedom to attempt PO when alert. Pt may be given bites of applesauce of small sips of water when responsive. Otherwise NPO.  Consider referal to palliative care.    Aspiration Risk  Moderate    Diet Recommendation NPO (puree or sips of water when alert)   Medication Administration: Via alternative means    Other  Recommendations Oral Care Recommendations: Oral care Q4 per protocol   Follow Up Recommendations  Skilled Nursing facility    Frequency and Duration min 2x/week  2 weeks   Pertinent Vitals/Pain NA    SLP Swallow Goals     Swallow Study Prior Functional Status       General HPI: Jyra Scarbrough is a 80 y.o. female, with history of  mild dementia, wheelchair dependent, brought by her daughter for multiple complaints, including generalized weakness over the last week, with one fall during transfer from bed to wheelchair, cough for last 24 hours,, generalized weakness and confusion, and shortness of breath, daughter reports she had upper respiratory infection recently, as well noticed in her mother having diarrhea x24 hours, patient chest x-ray did not show any acute findings. Pt with presumed influenza and CHF. Pt with decreased responsiveness, NG tube for feeding but not able to d/c pt with NG in place.  Type of Study: Bedside swallow evaluation Previous Swallow Assessment: none Diet Prior to this Study: NPO Temperature Spikes Noted: No Respiratory Status: Room air History of Recent Intubation: No Behavior/Cognition: Lethargic Oral Cavity - Dentition: Missing dentition;Poor condition Self-Feeding Abilities: Other (Comment) (not responsive) Patient Positioning: Upright in bed Baseline Vocal Quality: Hoarse Volitional Cough: Cognitively unable to elicit    Oral/Motor/Sensory Function Overall Oral Motor/Sensory Function: Other (comment) (does not respond)   Ice Chips Ice chips: Impaired Presentation: Spoon Oral Phase Impairments: Poor awareness of bolus Oral Phase Functional Implications: Right anterior spillage   Thin Liquid Thin Liquid: Not tested    Nectar Thick Nectar Thick Liquid: Not tested   Honey Thick Honey Thick Liquid: Not tested   Puree Puree: Impaired Oral Phase Impairments: Poor awareness of bolus   Solid   GO    Solid: Not tested      Herbie Baltimore, MA CCC-SLP [phone]  Lynann Beaver 06/21/2013,4:10 PM

## 2013-06-22 MED ORDER — LORAZEPAM 2 MG/ML IJ SOLN: 0.5000 mg | INTRAMUSCULAR | Status: AC | PRN

## 2013-06-22 MED ORDER — MORPHINE SULFATE (CONCENTRATE) 10 MG /0.5 ML PO SOLN: 2.5000 mg | ORAL | Status: AC | PRN

## 2013-06-22 MED ORDER — BISACODYL 10 MG RE SUPP: 10.0000 mg | Freq: Every day | RECTAL | Status: AC | PRN

## 2013-06-22 MED ORDER — SCOPOLAMINE 1 MG/3DAYS TD PT72: 1.0000 | MEDICATED_PATCH | TRANSDERMAL | Status: AC

## 2013-06-22 NOTE — Clinical Social Work Note (Signed)
Patient has bed at [facility]. CSW will contact unit RN when patient can be transported to facaility. Daughter is going to meet with facility to complete paperwork around lunch time. CSW to assist with DC.   Liz Beach, [place], [place], [phone]

## 2013-06-22 NOTE — Discharge Summary (Signed)
Physician Discharge Summary  Alexandra Lucas WGN:[id number] DOB: 1917-11-13 DOA: 06/15/2013  PCP: Maggie Font, MD  Admit date: 06/15/2013 Discharge date: 06/22/2013  Time spent: >30 minutes  Recommendations for Outpatient Follow-up:  1. Full comfort care 2. Discharge to residential hospice facility  Discharge Diagnoses:  Active Problems:   CHF (congestive heart failure)   HTN (hypertension)   Weakness   Confusion   Hyperlipemia   Hypothyroid   URI (upper respiratory infection)   Anemia   Palliative care encounter   DNR (do not resuscitate) Encephalopathy (presumed to be secondary to anoxic brain injury)  Discharge Condition: patient in no distress; stable VS and no complaining of pain.  Diet recommendation: dysphagia 1 and sips as tolerated for comfort  Filed Weights   06/20/13 1500 06/21/13 0450 06/22/13 0457  Weight: 80.9 kg (178 lb 5.6 oz) 79.8 kg (175 lb 14.8 oz) 80 kg (176 lb 5.9 oz)    History of present illness:  80 y.o. female, with history of mild dementia, wheelchair dependent, brought by her daughter for multiple complaints, including generalized weakness over the last week, with one fall during transfer from bed to wheelchair, cough for last 24 hours,, generalized weakness and confusion, and shortness of breath, daughter reports she had upper respiratory infection recently, as well noticed in her mother having diarrhea x24 hours, patient chest x-ray did not show any acute findings, did not have leukocytosis, her labs were significant for anemia of hemoglobin 8.5, last hemoglobin we have in the records was before 2 years and was level of 10, but daughter reports she had low hemoglobin, and was told this by her PCP, but she cannot recall the level exactly, patient had a rectal exam done by ED physician, was noticed to have large hemorrhoids, with minimal oozing from side, stool are normal in color, so patient was Hemoccult positive, there is no complaints of focal  deficits or tingling or numbness, as well patient found to have volume congestion on her chest x-ray and complaint of lower extremity edema, no history of CHF in the past.   Hospital Course:  Acute on chronic diastolic congestive heart failure:  -comfort feeding  -CXR on 2/10 demonstrating no edema or frank consolidations. Atelectasis in LLL still present.  -now that plan is for comfort measures will discontinue any aggressive treatment and use morphine/ativan for comfort and SOB  -2-D echo with preserved EF and demonstrating grade 1 diastolic dysfunction  -no JVD, trace LE edema  -scopolamine for increased nd of life secretions  Anemia: due to anemia of chronic disease  -plan is for comfort care  -ferrous sulfate discontinued as patient unable to safely swallow pills  Weakness and physical deconditioning:  -most likely related to her congestive heart failure/anemia/presumed bronchitis and influenza infection  -Patient has now become unresponsive and after [place] meeting with Palliative care plan is for full comfort care  -will discharge to residential hospice for full comfort  Confusion/dementia/encephalopathy (toxic vs hypoxic event vs meds):  -currently unresponsive  -positive influenza a  -plan is for comfort now after [place] meeting with Palliative care  -patient is responding to painful stimuli; no significant regain in mentation -MRI negative for acute stroke  -will follow response   Hyperlipidemia.  -statins discontinued  -full comfort now  -patient unable to swallow pills safely  Hypertension.  -full comfort care  -will stop antihypertensive agents as patient unable to safely swallow pills  Hypothyroidism.  -comfort care  -will stop synthroid -patient not taking PO meds  at this point  URI due to bronchitis and influenza:  -comfort care now  -no SOB  -morphine and ativan to be given for SOB   Acute on chronic renal failure  -dno further blood work will be drawn   -plan is for full comfort care  -last CR demonstrated improvement and levels of 0.92  Dysphagia  -back to unresponsive state  -family wants comfort  -will allow sips and dysphagia 1 for comfort feeding   Procedures:  Echo:  - Left ventricle: The cavity size was normal. Systolic function was normal. The estimated ejection fraction was in the range of 55% to 60%. Wall motion was normal; there were no regional wall motion abnormalities. Doppler parameters are consistent with abnormal left ventricular relaxation (grade 1 diastolic dysfunction). - Aortic valve: Mild regurgitation. - Mitral valve: Calcified annulus. - Left atrium: The atrium was mildly dilated. - Pulmonary arteries: PA peak pressure: 63mm Hg (S).    Consultations:  Palliative care   Discharge Exam: Filed Vitals:   06/22/13 0457  BP: 140/77  Pulse: 120  Temp: 97.2 F (36.2 C)  Resp: 18    General: unresponsive, no SOB, no pain appreciated Cardiology: S1 and S2, positive SEM  Respiratory: increase rhonchi, no wheezing, slight decreased BS at bases Abdomen: + Bowel sounds, no distension, no tenderness  Musculoskeletal: trace edema bilaterally  Discharge Instructions  Discharge Orders   Future Orders Complete By Expires   Diet - low sodium heart healthy  As directed    Discharge instructions  As directed    Comments:     Full comfort care Patient discharged with IV line to facilitate medication administration Dysphagia 1 and sips of liquids as tolerated for comfort feeding.   Increase activity slowly  As directed        Medication List    STOP taking these medications       amLODipine 2.5 MG tablet  Commonly known as:  NORVASC     bismuth subsalicylate 696 EX/52WU suspension  Commonly known as:  PEPTO BISMOL     clonazePAM 1 MG tablet  Commonly known as:  KLONOPIN     diclofenac sodium 1 % Gel  Commonly known as:  VOLTAREN     donepezil 10 MG tablet  Commonly known as:  ARICEPT      furosemide 20 MG tablet  Commonly known as:  LASIX     gabapentin 100 MG capsule  Commonly known as:  NEURONTIN     HYDROcodone-acetaminophen 5-325 MG per tablet  Commonly known as:  NORCO/VICODIN     hydrOXYzine 25 MG capsule  Commonly known as:  VISTARIL     levothyroxine 137 MCG tablet  Commonly known as:  SYNTHROID, LEVOTHROID     nitrofurantoin 50 MG capsule  Commonly known as:  MACRODANTIN     potassium chloride 10 MEQ tablet  Commonly known as:  K-DUR,KLOR-CON     pregabalin 75 MG capsule  Commonly known as:  LYRICA     rosuvastatin 10 MG tablet  Commonly known as:  CRESTOR     temazepam 15 MG capsule  Commonly known as:  RESTORIL     valsartan-hydrochlorothiazide 320-25 MG per tablet  Commonly known as:  DIOVAN-HCT      TAKE these medications       bisacodyl 10 MG suppository  Commonly known as:  DULCOLAX  Place 1 suppository (10 mg total) rectally daily as needed for mild constipation.     LORazepam 2 MG/ML injection  Commonly known as:  ATIVAN  Inject 0.25 mLs (0.5 mg total) into the vein every 4 (four) hours as needed for anxiety.     morphine CONCENTRATE 10 mg / 0.5 ml concentrated solution  Take 0.13 mLs (2.6 mg total) by mouth every 2 (two) hours as needed for moderate pain or shortness of breath.     scopolamine 1.5 MG  Commonly known as:  TRANSDERM-SCOP  Place 1 patch (1.5 mg total) onto the skin every 3 (three) days.       Allergies  Allergen Reactions  . Aspirin Nausea Only       Follow-up Information   Follow up with Maggie Font, MD On 06/24/2013. ($RemoveBefore'@2'IvsBSlRFKFCDt$ :00 pm spoke with Kathaleen Maser )    Specialty:  Family Medicine   Contact information:   [place] [id number] [phone]        The results of significant diagnostics from this hospitalization (including imaging, microbiology, ancillary and laboratory) are listed below for reference.    Significant Diagnostic Studies: Dg Chest 2 View  06/18/2013    CLINICAL DATA:  Shortness of Breath  EXAM: CHEST  2 VIEW  COMPARISON:  June 15, 2013  FINDINGS: There is cardiomegaly with mild pulmonary venous hypertension, stable. There is mild atelectatic change in the left base. There is no frank edema or consolidation. No adenopathy. There is degenerative change in the thoracic spine.  IMPRESSION: Findings suggesting a degree of volume overload but no frank edema or consolidation. There is mild stable atelectasis in the left base. No pneumothorax.   Electronically Signed   By: Lowella Grip M.D.   On: 06/18/2013 07:46   Dg Chest 2 View  06/15/2013   CLINICAL DATA:  Leg swelling, cough.  EXAM: CHEST  2 VIEW  COMPARISON:  08/26/2007  FINDINGS: Cardiomegaly. Vascular congestion. No overt edema. No confluent opacities. Suspect trace effusions posteriorly on the lateral view. No acute bony abnormality.  IMPRESSION: Cardiomegaly with vascular congestion and trace bilateral effusions.   Electronically Signed   By: Rolm Baptise M.D.   On: 06/15/2013 17:37   Ct Head Wo Contrast  06/16/2013   CLINICAL DATA:  Confusion.  EXAM: CT HEAD WITHOUT CONTRAST  TECHNIQUE: Contiguous axial images were obtained from the base of the skull through the vertex without intravenous contrast.  COMPARISON:  11/16/2007  FINDINGS: Right occipital encephalomalacia is again seen, consistent with remote infarct. Mild age-related cerebral atrophy is present. There is no evidence of acute cortical infarct, mass, midline shift, intracranial hemorrhage, or extra-axial fluid collection. Prior bilateral cataract surgery is noted. Mastoid air cells and visualized paranasal sinuses are clear.  IMPRESSION: No evidence of acute intracranial abnormality.   Electronically Signed   By: Logan Bores   On: 06/16/2013 00:48   Mr Brain Wo Contrast  06/18/2013   CLINICAL DATA:  History of mild dimension. Gradual increased weakness and confusion. History of hypertension and hyperlipidemia.  EXAM: MRI HEAD WITHOUT  CONTRAST  TECHNIQUE: Multiplanar, multiecho pulse sequences of the brain and surrounding structures were obtained without intravenous contrast.  COMPARISON:  06/15/2013 CT.  No comparison MR.  FINDINGS: No acute infarct  No intracranial hemorrhage.  Remote right occipital lobe infarct with encephalomalacia.  Mild global atrophy without hydrocephalus.  No intracranial mass lesion noted on this unenhanced exam.  Major intracranial vascular structures are patent.  Mild paranasal sinus mucosal thickening.  Exophthalmos.  Cervical medullary junction unremarkable. Mild cervical spondylotic changes C3-4. Mild transverse ligament hypertrophy.  IMPRESSION:  No acute infarct  Remote right occipital lobe infarct with encephalomalacia.  Mild global atrophy without hydrocephalus.  No intracranial mass lesion noted on this unenhanced exam.  Mild paranasal sinus mucosal thickening.   Electronically Signed   By: Chauncey Cruel M.D.   On: 06/18/2013 17:57    Microbiology: Recent Results (from the past 240 hour(s))  URINE CULTURE     Status: None   Collection Time    06/15/13  5:01 PM      Result Value Ref Range Status   Specimen Description URINE, CLEAN CATCH   Final   Special Requests NONE   Final   Culture  Setup Time     Final   Value: 06/15/2013 22:15     Performed at [facility]     Final   Value: 50,000 COLONIES/ML     Performed at [facility]   Culture     Final   Value: Multiple bacterial morphotypes present, none predominant. Suggest appropriate recollection if clinically indicated.     Performed at [facility]   Report Status 06/16/2013 FINAL   Final  RESPIRATORY VIRUS PANEL     Status: Abnormal   Collection Time    06/15/13  5:07 PM      Result Value Ref Range Status   Source - [facility] NASAL SWAB   Corrected   Comment: CORRECTED ON 02/09 AT 1851: PREVIOUSLY REPORTED AS NASAL SWAB   Respiratory Syncytial Virus A NOT DETECTED   Final   Respiratory Syncytial  Virus B NOT DETECTED   Final   Influenza A DETECTED (*)  Final   Influenza B NOT DETECTED   Final   Parainfluenza 1 NOT DETECTED   Final   Parainfluenza 2 NOT DETECTED   Final   Parainfluenza 3 NOT DETECTED   Final   Metapneumovirus NOT DETECTED   Final   Rhinovirus NOT DETECTED   Final   Adenovirus NOT DETECTED   Final   Influenza A H1 NOT DETECTED   Final   Influenza A H3 NOT DETECTED   Final   Comment: (NOTE)           Normal Reference Range for each Analyte: NOT DETECTED     Testing performed using the Luminex xTAG Respiratory Viral Panel test     kit.     This test was developed and its performance characteristics determined     by [facility]. It has not been cleared or approved by the [place]     Food and Drug Administration. This test is used for clinical purposes.     It should not be regarded as investigational or for research. This     laboratory is certified under the [place] (CLIA) as qualified to perform high complexity     clinical laboratory testing.     Performed at [facility]: Basic Metabolic Panel:  Recent Labs Lab 06/18/13 0300 06/18/13 1005 06/19/13 0407 06/20/13 0632 06/21/13 0421  NA 147 149* 146 146 145  K 4.4 3.7 4.0 3.8 3.9  CL 104 106 103 106 105  CO2 29 34* $Remo'29 27 26  'PJsIM$ GLUCOSE 62* 96 100* 126* 103*  BUN 34* 32* 27* 23 21  CREATININE 1.21* 1.27* 1.35* 1.14* 0.92  CALCIUM 9.4 9.4 9.5 9.6 9.4   Liver Function Tests:  Recent Labs Lab 06/15/13 1615  AST 26  ALT  32  ALKPHOS 59  BILITOT <0.2*  PROT 6.4  ALBUMIN 3.0*   CBC:  Recent Labs Lab 06/15/13 1615 06/15/13 2150 06/16/13 0358 06/16/13 1435 06/18/13 0300 06/21/13 0421  WBC 4.9 4.1 4.0  --  6.5 4.4  NEUTROABS 3.2  --   --   --   --   --   HGB 8.5* 8.6* 8.7* 8.4* 10.2* 10.1*  HCT 26.1* 27.0* 27.2* 26.0* 31.3* 30.5*  MCV 94.2 94.4 95.1  --  91.8 91.6  PLT 112* 101* 118*  --  75* 91*   BNP: BNP (last 3  results)  Recent Labs  06/15/13 1615 06/18/13 0300  PROBNP 458.6* 1139.0*   CBG:  Recent Labs Lab 06/20/13 1545 06/20/13 1956 06/21/13 0425 06/21/13 0832 06/21/13 1154  GLUCAP 114* 138* 102* 139* 120*    Signed:  Parvin Stetzer  [facility] 06/22/2013, 12:56 PM

## 2013-06-22 NOTE — Clinical Social Work Note (Signed)
Per MD patient ready to DC to [facility]. RN, family, and facility aware of DC. RN given number for report. DC packet left on chart. EMS transport requested for patient. CSW signing off at this time.  Liz Beach, [place], [place], [phone]

## 2013-06-22 NOTE — Clinical Social Work Note (Signed)
Patient to be discharged to [facility] once DC order and DC Summary are received. CSW text paged MD.  Liz Beach, [place], [place], [phone]

## 2013-06-23 NOTE — Progress Notes (Signed)
Client was released to [facility].  Report called to [place].  Client wrapped in warm blankets for transport and orange sweater cap on head.  IV was left in, as well as, foley per request of Hospice Nursing.

## 2013-07-06 NOTE — Consult Note (Signed)
I have reviewed and discussed the care of this patient in detail with the nurse practitioner including pertinent patient records, physical exam findings and data. I agree with details of this encounter.  

## 2013-07-07 DEATH — deceased

## 2014-09-17 IMAGING — CR DG CHEST 2V
2 series · 2 of 2 positions shown · non-contrast
Comparison: June 15, 2013

CLINICAL DATA: Shortness of Breath

EXAM:
CHEST  2 VIEW

[x chest ap]
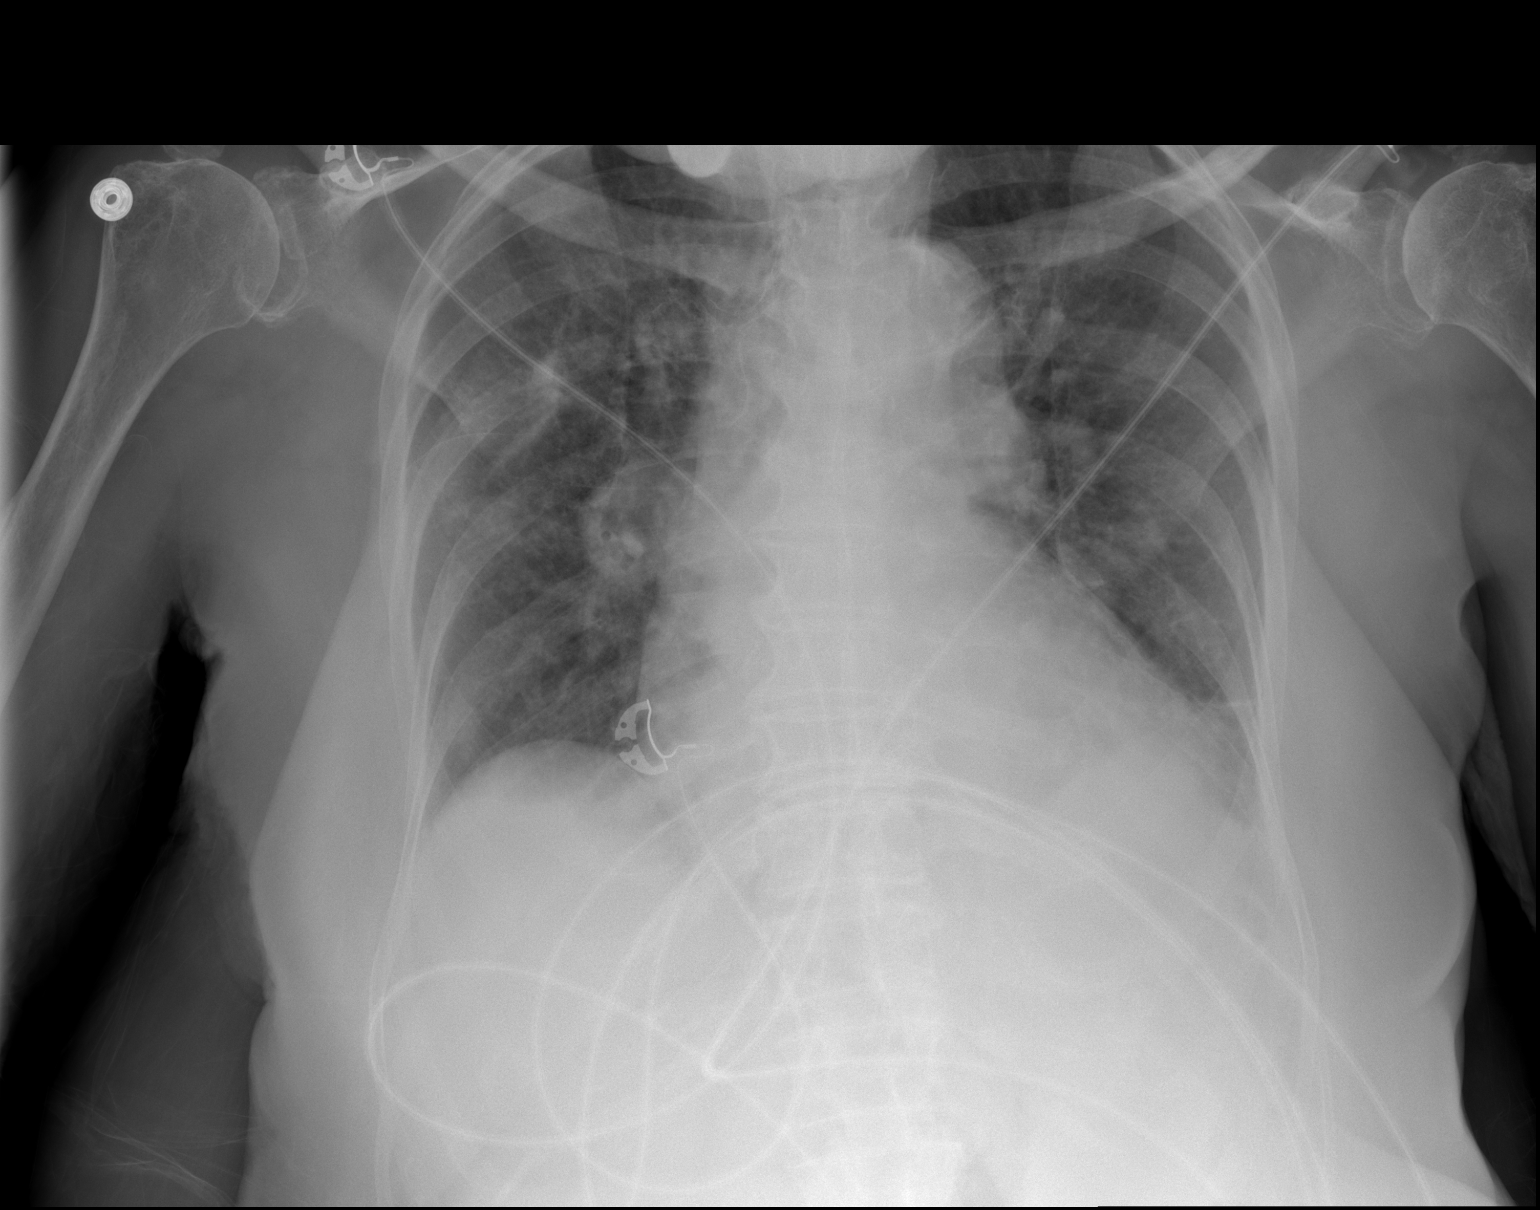

[w chest lat]
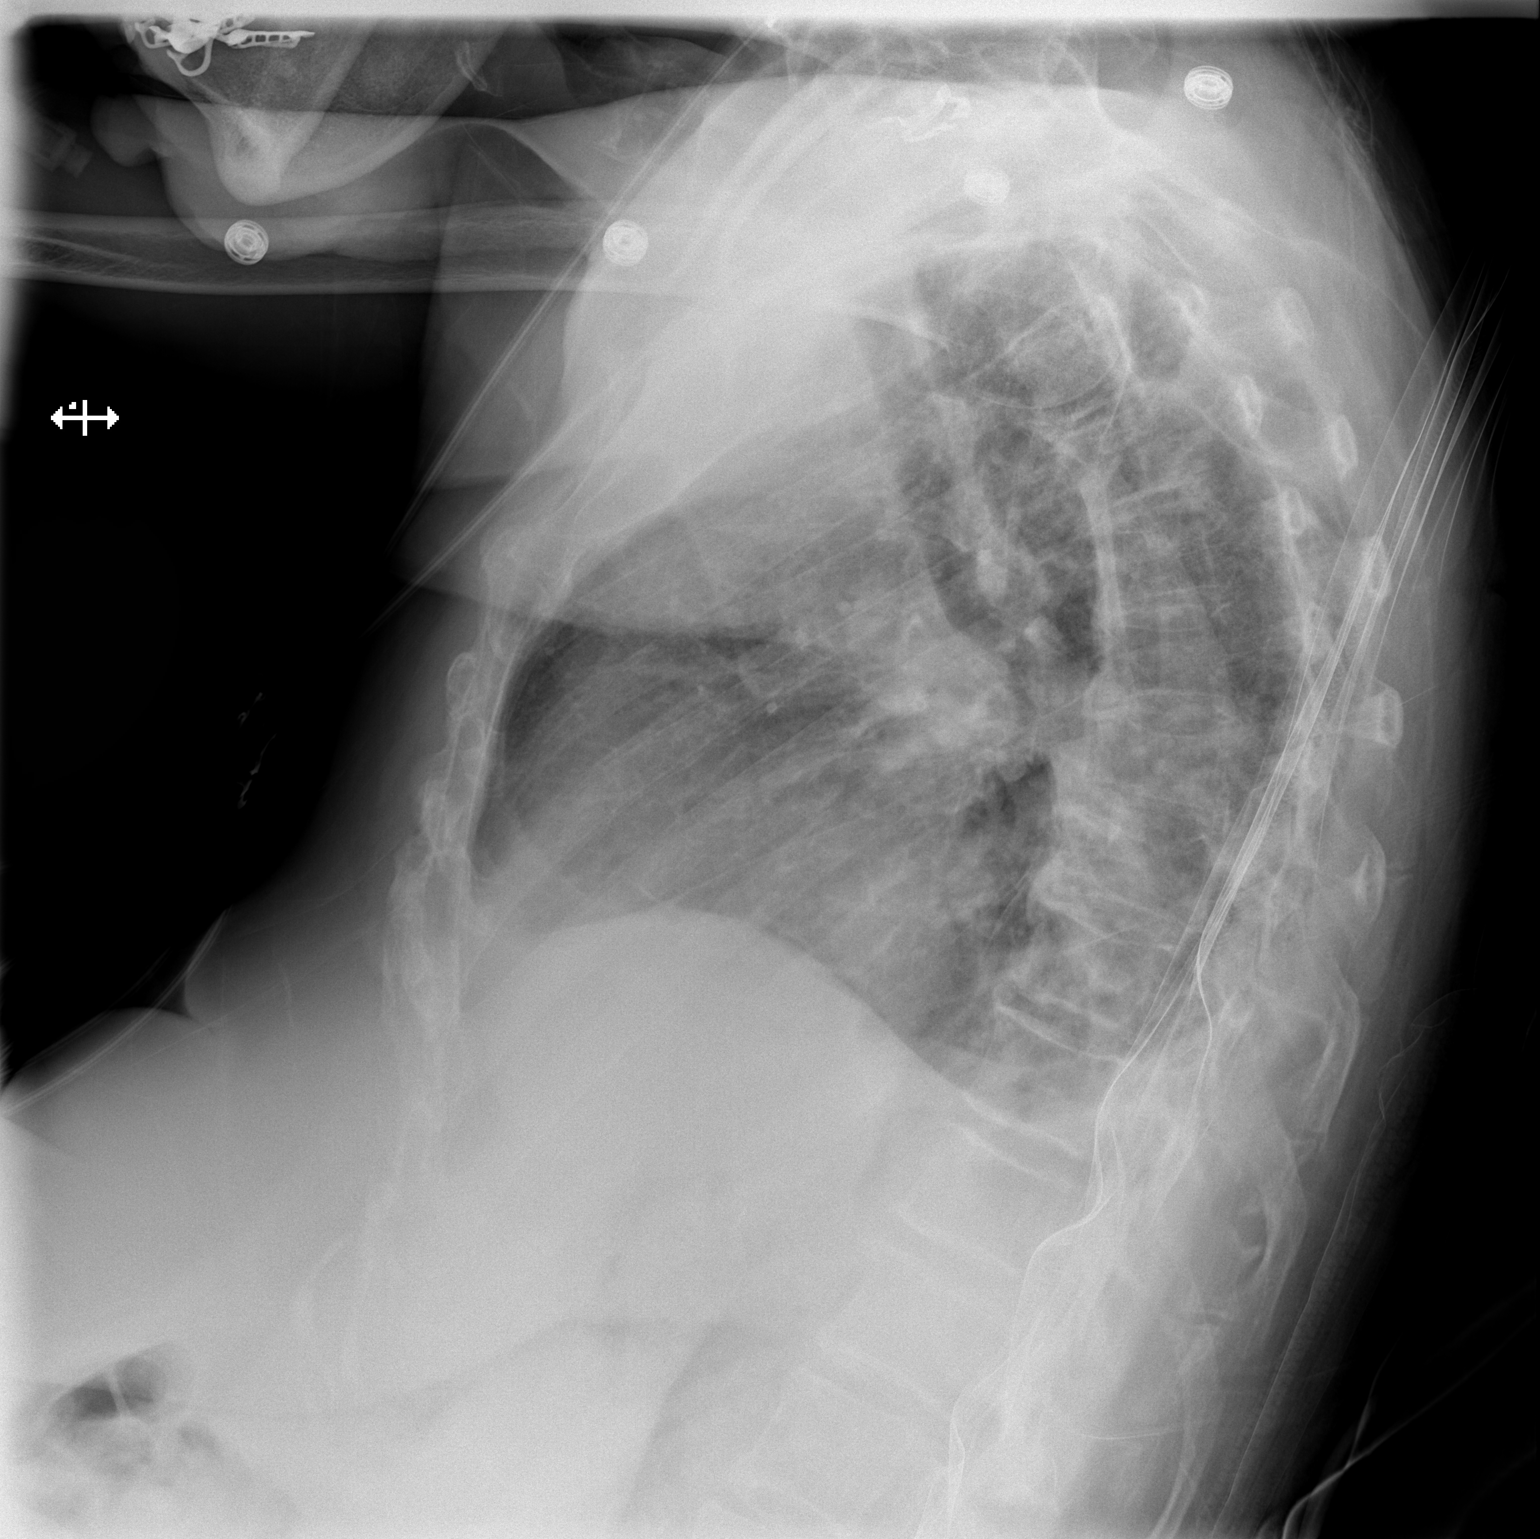

[2 of 2 positions shown; findings below may reference images not displayed]

FINDINGS: There is cardiomegaly with mild pulmonary venous hypertension,
stable. There is mild atelectatic change in the left base. There is
no frank edema or consolidation. No adenopathy. There is
degenerative change in the thoracic spine.
IMPRESSION: Findings suggesting a degree of volume overload but no frank edema
or consolidation. There is mild stable atelectasis in the left base.
No pneumothorax.
